# Patient Record
Sex: Male | Born: 1960 | Race: Black or African American | Hispanic: No | Marital: Married | Smoking: Never smoker
Health system: Southern US, Community
[De-identification: ages and names within clinical notes are randomized; demographics above are authoritative.]

## PROBLEM LIST (undated history)

## (undated) DIAGNOSIS — M109 Gout, unspecified: Secondary | ICD-10-CM

## (undated) DIAGNOSIS — M1990 Unspecified osteoarthritis, unspecified site: Secondary | ICD-10-CM

## (undated) DIAGNOSIS — I10 Essential (primary) hypertension: Secondary | ICD-10-CM

## (undated) HISTORY — PX: ROTATOR CUFF REPAIR: SHX139

## (undated) HISTORY — PX: HERNIA REPAIR: SHX51

## (undated) HISTORY — PX: TOE SURGERY: SHX1073

## (undated) HISTORY — PX: WRIST SURGERY: SHX841

## (undated) HISTORY — PX: TESTICLE SURGERY: SHX794

## (undated) HISTORY — PX: CERVICAL SPINE SURGERY: SHX589

---

## 2002-07-15 ENCOUNTER — Ambulatory Visit (HOSPITAL_COMMUNITY): Admission: RE | Admit: 2002-07-15 | Discharge: 2002-07-15 | Payer: Self-pay | Admitting: *Deleted

## 2002-08-22 ENCOUNTER — Encounter: Payer: Self-pay | Admitting: Family Medicine

## 2002-08-22 ENCOUNTER — Encounter: Admission: RE | Admit: 2002-08-22 | Discharge: 2002-08-22 | Payer: Self-pay | Admitting: Family Medicine

## 2003-02-20 ENCOUNTER — Encounter: Payer: Self-pay | Admitting: Family Medicine

## 2003-02-20 ENCOUNTER — Encounter: Admission: RE | Admit: 2003-02-20 | Discharge: 2003-02-20 | Payer: Self-pay | Admitting: Family Medicine

## 2003-07-03 ENCOUNTER — Encounter
Admission: RE | Admit: 2003-07-03 | Discharge: 2003-10-01 | Payer: Self-pay | Admitting: Physical Medicine and Rehabilitation

## 2003-10-02 ENCOUNTER — Observation Stay (HOSPITAL_COMMUNITY): Admission: RE | Admit: 2003-10-02 | Discharge: 2003-10-02 | Payer: Self-pay | Admitting: *Deleted

## 2003-10-10 ENCOUNTER — Emergency Department (HOSPITAL_COMMUNITY): Admission: EM | Admit: 2003-10-10 | Discharge: 2003-10-10 | Payer: Self-pay | Admitting: Emergency Medicine

## 2003-11-12 ENCOUNTER — Emergency Department (HOSPITAL_COMMUNITY): Admission: EM | Admit: 2003-11-12 | Discharge: 2003-11-12 | Payer: Self-pay | Admitting: Emergency Medicine

## 2004-07-22 ENCOUNTER — Emergency Department (HOSPITAL_COMMUNITY): Admission: EM | Admit: 2004-07-22 | Discharge: 2004-07-22 | Payer: Self-pay | Admitting: Emergency Medicine

## 2004-09-25 ENCOUNTER — Emergency Department (HOSPITAL_COMMUNITY): Admission: EM | Admit: 2004-09-25 | Discharge: 2004-09-25 | Payer: Self-pay | Admitting: Emergency Medicine

## 2004-10-08 ENCOUNTER — Encounter: Admission: RE | Admit: 2004-10-08 | Discharge: 2004-10-08 | Payer: Self-pay | Admitting: Family Medicine

## 2004-10-14 ENCOUNTER — Emergency Department (HOSPITAL_COMMUNITY): Admission: EM | Admit: 2004-10-14 | Discharge: 2004-10-14 | Payer: Self-pay | Admitting: Emergency Medicine

## 2006-10-19 ENCOUNTER — Emergency Department (HOSPITAL_COMMUNITY): Admission: EM | Admit: 2006-10-19 | Discharge: 2006-10-19 | Payer: Self-pay | Admitting: Emergency Medicine

## 2007-07-29 ENCOUNTER — Emergency Department (HOSPITAL_COMMUNITY): Admission: EM | Admit: 2007-07-29 | Discharge: 2007-07-29 | Payer: Self-pay | Admitting: Emergency Medicine

## 2009-08-08 ENCOUNTER — Emergency Department: Payer: Self-pay | Admitting: Emergency Medicine

## 2010-05-02 ENCOUNTER — Emergency Department (HOSPITAL_BASED_OUTPATIENT_CLINIC_OR_DEPARTMENT_OTHER)
Admission: EM | Admit: 2010-05-02 | Discharge: 2010-05-02 | Payer: Self-pay | Source: Home / Self Care | Admitting: Emergency Medicine

## 2010-05-14 ENCOUNTER — Emergency Department (HOSPITAL_BASED_OUTPATIENT_CLINIC_OR_DEPARTMENT_OTHER)
Admission: EM | Admit: 2010-05-14 | Discharge: 2010-05-14 | Payer: Self-pay | Source: Home / Self Care | Admitting: Emergency Medicine

## 2010-06-15 ENCOUNTER — Emergency Department (HOSPITAL_BASED_OUTPATIENT_CLINIC_OR_DEPARTMENT_OTHER)
Admission: EM | Admit: 2010-06-15 | Discharge: 2010-06-15 | Payer: Self-pay | Source: Home / Self Care | Admitting: Emergency Medicine

## 2010-10-04 NOTE — Assessment & Plan Note (Signed)
[name] is in for a recheck today.  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  He has had gradual  onset of neck and shoulder pain and more recently in the last four years,  some elbow and had pain, as well as some left knee pain.  He has been worked  up by rheumatology and a neurologist.  He brings in some of his blood tests  today that were done on June 30, 2003, which include CBC with  differential which was normal with the exception of a low granulocyte count  at 39 (normal between 43-77) and also elevated lymphocytes at 52 (normal  between 12-46).  Comprehensive metabolic panel was within normal limits and  HLAB27 was not detected.  CPK was 262 (normal between 24-195).   He also brings in bilateral hand, knee, and shoulder and cervical  radiographs.  These were reviewed.  Reports were not with these today.  They  will be sent later on.   He reports that he continues to have especially elbow pain and some hand  discomfort, especially after he has been driving.  He rates his pain as a 9  on a scale of 10.   I questioned him about sleeping.  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  He is unable to  get a good night's rest just because he is on the road.   Basically prolonged driving exacerbates his neck, shoulder, elbow, hand, and  knee pain.   He has been on Mobic 7.5 mg daily.  He ran out a few days ago.  He is not  sure it helped dramatically.   We talk at length today about his profession.  He tells me has been driving  25 years and feels as though he is not sure he can keep doing this for many  more years.  He tells me he is almost 35 and is thinking possibly about  switching careers at this point.   On exam today, he is a well-developed, well-nourished gentleman.  He is  appropriate and cooperative and not in any apparent distress.   His blood  pressure is 131/75, pulse 83, respirations 16, and 98% saturated  on room air.  He is able to get off of the exam table easily.  His gait is  normal in the room.  No antalgia.  He is able to walk on heels and toes  without difficulty.  Forward flexion and extension and lateral flexion of  his lumbar spine are within normal limits.  He has slight limitations with  cervical range of motion, just minimal decreased motion and range with  rotation and lateral flexion.  He does not remark on any discomfort during  these maneuvers.  He has full shoulder range of motion.  Internal and  external rotation are adequate and functional.  He has some limitations in  pronation and supination bilaterally.  He lacks approximately 5 degrees with  both pronation and supination on right and left.  He lacks approximately  about 20 degrees of wrist extension and about 10-15 degrees of full wrist  flexion and this is bilateral.  He reports some discomfort with wrist  flexion and extension.  There is, however, no edema or tenderness along the  joint line.  No tenderness today along the radial head with supination and  pronation either.  His muscle tone overall is normal.  The knees were  examined as well.  He has some mild crepitus over the left patella.  No  joint line tenderness.  No edema or fluid noted around the knee.  There is  no instability noted as well.   The heart was in regular rhythm.  The lungs were clear.  The abdomen was  benign.  Cranial nerves were grossly intact.  The sensory exam did not  reveal any deficits to light touch or vibratory sensation.  Romberg's test  was negative.  Reflexes were 1+ in biceps, triceps, brachioradialis, and 1+  at patellar tendons and Achilles' tendons.  Toes were downgoing.  No clonus  was noted.  Motor strength is in the 5/5 range in the upper and lower  extremities, including shoulder abductors, biceps, triceps, wrist extensors,  finger flexors, and intrinsics  and 5/5 at bilateral hip flexors, knee  extensors, dorsiflexors, plantar flexors, and EHL.   IMPRESSION:  1. Cervicalgia.  2. Bilateral patellar crepitus.  3. Elbow and hand musculoskeletal strain.  4. 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   PLAN:  1. Will add Ultracet two p.o. q.6h. p.r.n.  2. Will refill Mobic 7.5 mg one p.o. b.i.d.  3. Will add Prevacid 30 mg one p.o. daily.  4. Will refer the patient to vocational rehabilitation.  5. Will also consider Lidoderm.  6. Next visit, will consider physical therapy for education on proper body     mechanics and a stretching program.      Joshua Mercer, M.D.   DMK/MedQ  D:  08/09/2003 12:38:23  T:  08/09/2003 17:35:35  Job #:  [id number]   cc:   Leanne Chang, M.D.  [address]  [place]  [place] [address]  Fax: [phone]   Dr. Corliss Skains

## 2010-10-04 NOTE — Op Note (Signed)
NAME:  Joshua Mercer, Joshua Mercer                        ACCOUNT NO.:  [id number]   MEDICAL RECORD NO.:  [id number]                   PATIENT TYPE:  OBV   LOCATION:  0349                                 FACILITY:  [facility]   PHYSICIAN:  Vikki Ports, M.D.         DATE OF BIRTH:  16-May-1961   DATE OF PROCEDURE:  10/02/2003  DATE OF DISCHARGE:                                 OPERATIVE REPORT   PREOPERATIVE DIAGNOSIS:  Recurrent ventral hernia.   POSTOPERATIVE DIAGNOSIS:  Recurrent ventral hernia.   PROCEDURE:  Laparoscopic ventral hernia repair with mesh.   SURGEON:  Vikki Ports, M.D.   ANESTHESIA:  General.   DESCRIPTION OF PROCEDURE:  The patient was taken to the operating room and  placed in the supine position.  After adequate general anesthesia was  induced, the abdomen was prepped and draped in the normal sterile fashion.  Using the Optiview technique in the left upper quadrant, an 11 mm trocar was  introduced.  Pneumoperitoneum was obtained.  Two additional ports were  placed in the right abdomen.  Some omentum was caught and incarcerated  within the hernia defect, and this was brought down using blunt and sharp  dissection.  The defect measured only about 3 cm and I used a 12 cm circular  dual-sided Parietex mesh.  Anchoring #1 Novofil sutures were placed, and  these were tacked up to the anterior abdominal wall through separate  incisions.  This covered the defect completely.  The periphery was tacked  with the Endotacker.  Pneumoperitoneum was released, the trocars were  removed, the skin was closed with staples, injected with Marcaine.  The  patient tolerated the procedure well and went to PACU in stable condition.                                               Vikki Ports, M.D.    KRH/MEDQ  D:  10/02/2003  T:  10/02/2003  Job:  [id number]

## 2010-10-04 NOTE — Group Therapy Note (Signed)
CHIEF COMPLAINT:  Neck, shoulder, elbow, hands and left knee pain.   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  He reports a gradual onset over approximately the  last 7 years of neck, shoulder, elbow, hand and knee pain.  There is nothing  in particular that has brought these pains on, they have just been gradually  getting worse over the last several years.  He relates having neck and  shoulder pain back in 1997 or 1998.  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   In 2001 both of his elbows started bothering him quite a bit and also down  into the hands.  He describes the forearm pain that he gets as achy and he  describes most of his other types of pain as quite achy although he  describes his left knee as burning-like and on fire into the left knee area.  These problems occasionally wake him up at night and he cannot remain in any  position too long without feeling worse and the achiness wakes him up at  night.  He does get some intermittent numbness and tingling into the arms  but nothing persistent or constant.  He has tried over-the-counter type  medications including Ben-Gay and Icy Hot.  He has tried Vioxx, Celebrex,  Bextra, Ultracet, Tylenol and Advil.  He reports none of these completely get rid of his pain.  He has had an EMG  nerve conduction study in June which was apparently normal.  He has had an  elevated CPK, etiology which is not clear at this time.  The medical records  were reviewed and he is currently seeing Dr. Corliss Skains, he has an  appointment on July 28, 2003 with her.  Apparently she had recently done  blood work on him, the results of which are still pending.  He has also seen  neurologists and I do not have notes regarding their  findings.   FUNCTIONAL STATUS:  This gentleman is independent with all his activities of  daily living and mobility.   REVIEW OF SYSTEMS:  Negative for any fevers, chills, sweats, weight changes,  skin problems, lung or chest problems.  Denies any heart, kidney or liver  problems, hypertension, diabetes, ulcers, denies diarrhea, constipation,  cancers, bleeding disorders or thyroid problems to his knowledge.   He reports his mood is somewhat depressed but he denies that he could harm  himself or others.   SOCIAL HISTORY:  He has been married 20 years, he last worked on June 09, 2003.  He denies alcohol, tobacco, or illegal drug use.  He lives with his  wife and his father-in-law.   FAMILY HISTORY:  His mother is 51 and has hypertension.  He does not know  his Dad's medical history.  He has 3 brothers, some have hypertension and  arthritis.   ALLERGIES:  He denies drug allergies.   MEDICATIONS:  Currently not on any medications.   PAST MEDICAL HISTORY:  Negative.   PAST SURGICAL HISTORY:  Remarkable for umbilical herniorrhaphy x3.  In  December, 2001 he had arthroscopic right shoulder surgery, and in December,  1998 he had a cyst removed from his left wrist.   PHYSICAL EXAMINATION:  Blood pressure was 124/88, pulse 69, respirations 16,  98% saturation on room air.  He is a well-developed, well-nourished gentleman in no apparent distress.  He was appropriate and cooperative during the interview.  Skin was  unremarkable.  HEENT was within normal limits.  Neck is supple without any  adenopathy.  He had good range of motion.  Heart was in regular rhythm,  lungs clear.  Abdomen soft, bowel sounds positive.  He was able to get off of the exam table easily.  His gait was normal in the  room.  Heel-toe walking was normal.  Romberg's test was normal.  Cranial  nerves were grossly normal.  Sensory exam without sensory deficits noted  with pin prick in the upper or lower extremities.   Reflexes were 2+ at the  biceps, triceps, brachioradialis bilaterally.  Reflexes were 2+ at the  patellar tendon and Achilles tendon bilaterally.  Toes are down going, no  clonus is noted.  Motor strength in the upper extremities was 5/5 without  any obvious weakness including shoulder abductors, biceps, triceps,  brachioradialis, pronation, supination, finger flexors and intrinsics.  The  lower extremity strength was also tested and was all in the 5/5 range  including hip flexors, knee extensors, dorsiflexors, plantar flexors,_____  and EHL.  No crepitus was noted at the left or right knee.  There was no  swelling.  No tenderness was noted along the medial or lateral joint line.  No medial or lateral instability was noted.  Lachman's test was negative.  Upper extremity exam revealed some mild crepitus bilaterally at the radial  head, no tenderness with palpation along the medial or lateral epicondyle  however.  He seemed to have some diffuse tenderness throughout the forearm  musculature with palpation.  There was no swelling noted at the wrist joints  or in the hands at all.  His shoulder range of motion was good, he had 90  degrees of external rotation and approximately 45% degrees of internal  rotation bilaterally.  He had full supination and pronation.  He seemed to  lack a few degrees of full extension at both elbows however.   Results were reviewed of the MRI of the cervical spine which was done August 22, 2002, I do not have the actual scans in the clinic today.  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  There is a small central protrusion at C4-5 with  effacement of the anterior subarachnoid space but not cord compression.   IMPRESSION:  Multiple musculoskeletal complaints including pain in the  shoulders, elbows, hands, especially left knee and neck area.  The patient has had an elevated CPK and is currently  getting a rheumatologic work up  through Dr. Corliss Skains and blood results are still pending.  He has had an  essentially EMG and MRI which shows some mild borderline cervical canal  stenosis.  These symptoms have been slowly progressive over the last 7 or so  years.  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   Will start him on Mobick 7.5 daily, a 30 days supply.  He will let us know  if he has any abdominal problems with this and will wait for some of the  other specialists to get back some of their blood work  results.  Will hold  off on doing any other testing at this time.  Will see him back in  approximately one month and reassess him once we have some more information  regarding his tests.     Joshua Mercer, M.D.   DMK/MedQ  D:  07/05/2003 17:05:04  T:  07/05/2003 19:43:11  Job #:  [id number]   cc:   Leanne Chang, M.D.  [address]  [place]  [place] [address]  Fax: [phone]

## 2010-10-04 NOTE — Op Note (Signed)
   NAME:  Joshua Mercer, Joshua Mercer                          ACCOUNT NO.:  [id number]   MEDICAL RECORD NO.:  [id number]                   PATIENT TYPE:  AMB   LOCATION:  DAY                                  FACILITY:  [facility]   PHYSICIAN:  Vikki Ports, M.D.         DATE OF BIRTH:  08-Nov-1960   DATE OF PROCEDURE:  07/15/2002  DATE OF DISCHARGE:                                 OPERATIVE REPORT   PREOPERATIVE DIAGNOSIS:  Recurrent umbilical hernia.   POSTOPERATIVE DIAGNOSIS:  Recurrent umbilical hernia.   PROCEDURE:  Umbilical hernia repair with mesh.   SURGEON:  Vikki Ports, M.D.   ANESTHESIA:  General.   DESCRIPTION OF PROCEDURE:  The patient was taken to the operating room and  placed in a supine position.  After adequate general anesthesia was induced,  the abdomen was prepped and draped in the normal sterile fashion.  Using the  extended previous infraumbilical incision, I dissected down onto the hernia  sac.  There was still suture and mesh in place.  The defect was about 4 cm  in diameter, and there was some weakness superiorly to it.  After the  omentum was released from the edges of the hernia defect and returned to the  abdominal cavity, the defect was closed with interrupted #1 Surgilon, and  flaps were created just above the fascia in all directions.  A 5 x 4 inch  polyester mesh was then placed over the repair and tacked in the periphery  with running 2-0 Prolene suture.  JP drain was placed over the repair.  Skin  was closed with staples.  All tissues were injected using Marcaine.  A  sterile dressing was applied.  The patient tolerated the procedure well and  went to PACU in good condition.                                               Vikki Ports, M.D.    KRH/MEDQ  D:  07/15/2002  T:  07/15/2002  Job:  [id number]

## 2010-10-04 NOTE — Assessment & Plan Note (Signed)
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  He has had a gradual onset of neck and shoulder  pain, as well as some elbow pain and some knee pain on the left.  He has  seen a rheumatologist and was evaluated for an elevated CPK.  It is felt  that the elevated CPK was secondary to his having a good amount of muscle  mass.  He has also had several tests, including CBC with differential,  sedimentation rate, comprehensive metabolic panel, TSH, rheumatoid factor,  compliments, ANA, UA, aldolase, and __________, which were all within normal  limits.  Apparently Dr. Corliss Skains offered him to have a knee injection and  he declined.  She considered Neurontin and Lidoderm and he declined as well.  He is felt to have some myofascial-type pain problems.   He is in today for recheck.  He tells me that the Ultracet does not really  help that much.  He still has quite a bit at home.  Mobic helps.  He has  been taking that.  He also takes Prevacid.  He had some abdominal complaints  and did stop the Mobic, however.  He has been diagnosed with an umbilical  hernia and is to see a surgeon apparently in approximately two days.  He  continues to work.  We discussed this.  He seems frustrated with his job.  He has been a Naval architect for approximately 25 years and he seems to be  having trouble keeping up with the pace that he has had over those years.  He is feeling like he may need to look into other lines of vocation.  He was  given a vocational rehabilitation referral.  He brings in a sheet today from  the [facility] [employer] and a form  for me to fill out.  Essentially he is having trouble maintaining his pace  of long distance driving with very little sleep.  He thinks that it would be  beneficial to try to find some other type of work.  However, he feels his  skills are not adequate to enter the work force  at his age.   MEDICATIONS:  His medications at this time include Mobic 7.5 mg daily.   PHYSICAL EXAMINATION:  On exam today, he is a very well-developed, well-  nourished gentleman.  He does not appear in any distress.  He is  cooperative.  He appears somewhat frustrated with his current situation.  His blood pressure is 157/103.  We remarked to him that it is elevated.  His  pulse is 87, respirations 16, and 98% saturated on room air.  He is able to  get off of the exam table.  His gait in the room is normal.  He has good  lumbar flexion, extension, and lateral flexion.  Cervical range of motion is  quite good as well.  He has full shoulder range of motion in both shoulders.  His heart is in regular rhythm.  The lungs are clear.  The abdomen is  benign.  He has normal tone.  No tremor noted in any of his extremities.  No  mottling.  No erythema or cyanosis noted.  No edema is noted.  He has no  tenderness with palpation along his shoulders or his lateral or medial  epicondyle area.  He has good range of motion with pronation and supination.  Good strength in his hands.  Reflexes are symmetric and intact in the upper and lower extremities.  No  sensory deficits are noted.  Motor strength is 5/5.   IMPRESSION:  1. Myofascial shoulder, elbow, and hand strain.  2. Cervicalgia.   PLAN:  Will go ahead and fill out the [employer] form.  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  He is having a difficult time recovery  after long truck rides and is continuing to get a limited amount of sleep,  which is again making it difficult for him to recover after these long  distances.  Will give him a prescription for Lidoderm and will see him back  in a month.  Will see if vocational rehabilitation can assist him in  possibly  retraining and finding a new line of work for him.    Joshua Mercer, M.D.   DMK/MedQ  D:  09/06/2003 17:53:39  T:  09/06/2003 19:07:42  Job #:  [id number]

## 2011-05-31 ENCOUNTER — Emergency Department (INDEPENDENT_AMBULATORY_CARE_PROVIDER_SITE_OTHER): Payer: 59

## 2011-05-31 ENCOUNTER — Emergency Department (HOSPITAL_BASED_OUTPATIENT_CLINIC_OR_DEPARTMENT_OTHER)
Admission: EM | Admit: 2011-05-31 | Discharge: 2011-05-31 | Disposition: A | Discharge status: Home / Self Care | Payer: 59 | Attending: Emergency Medicine | Admitting: Emergency Medicine

## 2011-05-31 ENCOUNTER — Encounter (HOSPITAL_BASED_OUTPATIENT_CLINIC_OR_DEPARTMENT_OTHER): Payer: Self-pay | Admitting: *Deleted

## 2011-05-31 ENCOUNTER — Other Ambulatory Visit: Payer: Self-pay

## 2011-05-31 DIAGNOSIS — R079 Chest pain, unspecified: Secondary | ICD-10-CM | POA: Insufficient documentation

## 2011-05-31 DIAGNOSIS — I10 Essential (primary) hypertension: Secondary | ICD-10-CM | POA: Insufficient documentation

## 2011-05-31 DIAGNOSIS — Z79899 Other long term (current) drug therapy: Secondary | ICD-10-CM | POA: Insufficient documentation

## 2011-05-31 DIAGNOSIS — Z87891 Personal history of nicotine dependence: Secondary | ICD-10-CM

## 2011-05-31 HISTORY — DX: Essential (primary) hypertension: I10

## 2011-05-31 HISTORY — DX: Unspecified osteoarthritis, unspecified site: M19.90

## 2011-05-31 LAB — CBC
HCT: 41.3 % (ref 39.0–52.0)
MCHC: 33.9 g/dL (ref 30.0–36.0)
MCV: 78.4 fL (ref 78.0–100.0)
RDW: 13.4 % (ref 11.5–15.5)

## 2011-05-31 LAB — BASIC METABOLIC PANEL
BUN: 18 mg/dL (ref 6–23)
CO2: 28 mEq/L (ref 19–32)
Chloride: 105 mEq/L (ref 96–112)
Creatinine, Ser: 1.3 mg/dL (ref 0.50–1.35)
Potassium: 3.4 mEq/L — ABNORMAL LOW (ref 3.5–5.1)
Sodium: 142 mEq/L (ref 135–145)

## 2011-05-31 LAB — TROPONIN I: Troponin I: <0.3 ng/mL (ref <0.30)

## 2011-05-31 MED ORDER — MORPHINE SULFATE 4 MG/ML IJ SOLN
4.0000 mg | Freq: Once | INTRAMUSCULAR | Status: AC
  Administered 2011-05-31: 4 mg via INTRAVENOUS

## 2011-05-31 MED ORDER — GI COCKTAIL ~~LOC~~
30.0000 mL | Freq: Once | ORAL | Status: AC
  Administered 2011-05-31: 30 mL via ORAL
  Filled 2011-05-31: qty 30

## 2011-05-31 MED ORDER — MORPHINE SULFATE 4 MG/ML IJ SOLN
INTRAMUSCULAR | Status: AC
  Filled 2011-05-31: qty 1

## 2011-05-31 MED ORDER — MORPHINE SULFATE 4 MG/ML IJ SOLN
4.0000 mg | Freq: Once | INTRAMUSCULAR | Status: AC
  Administered 2011-05-31: 4 mg via INTRAVENOUS
  Filled 2011-05-31: qty 1

## 2011-05-31 MED ORDER — ONDANSETRON HCL 4 MG/2ML IJ SOLN
4.0000 mg | Freq: Once | INTRAMUSCULAR | Status: AC
  Administered 2011-05-31: 4 mg via INTRAVENOUS
  Filled 2011-05-31: qty 2

## 2011-05-31 NOTE — ED Notes (Signed)
Patient states that approx one hour ago he started experiencing Mid chest pain that has not eased off and concerned due to hypertension, no sob, no n/v

## 2011-05-31 NOTE — ED Provider Notes (Signed)
History     CSN: [id number]  Arrival date & time 05/31/11  1257   First MD Initiated Contact with Patient 05/31/11 1357      Chief Complaint  Patient presents with  . Chest Pain    (Consider location/radiation/quality/duration/timing/severity/associated sxs/prior treatment) Patient is a 51 y.o. male presenting with chest pain. The history is provided by the patient. No language interpreter was used.  Chest Pain The chest pain began 3 - 5 hours ago. Chest pain occurs constantly. The chest pain is unchanged. The severity of the pain is moderate. The quality of the pain is described as heavy. The pain does not radiate. Chest pain is worsened by deep breathing. Pertinent negatives for primary symptoms include no fever, no shortness of breath, no cough, no palpitations, no abdominal pain, no nausea, no vomiting and no dizziness.  Pertinent negatives for associated symptoms include no diaphoresis, no lower extremity edema, no near-syncope and no weakness. He tried nothing for the symptoms. Risk factors include male gender.  His past medical history is significant for hypertension.  His family medical history is significant for hypertension in family.     Past Medical History  Diagnosis Date  . Hypertension   . Arthritis     Past Surgical History  Procedure Date  . Hernia repair   . Wrist surgery   . Rotator cuff repair     right  . Toe surgery     R foot  . Testicle surgery     No family history on file.  History  Substance Use Topics  . Smoking status: Never Smoker   . Smokeless tobacco: Not on file  . Alcohol Use: No      Review of Systems  Constitutional: Negative for fever and diaphoresis.  Respiratory: Negative for cough and shortness of breath.   Cardiovascular: Positive for chest pain. Negative for palpitations and near-syncope.  Gastrointestinal: Negative for nausea, vomiting and abdominal pain.  Neurological: Negative for dizziness and weakness.  All other  systems reviewed and are negative.    Allergies  Review of patient's allergies indicates no known allergies.  Home Medications   Current Outpatient Rx  Name Route Sig Dispense Refill  . CYCLOBENZAPRINE HCL 5 MG PO TABS Oral Take 10 mg by mouth 3 (three) times daily as needed.    Marland Kitchen HYDROCHLOROTHIAZIDE 25 MG PO TABS Oral Take 25 mg by mouth daily.    Marland Kitchen HYDROCODONE-ACETAMINOPHEN 5-325 MG PO TABS Oral Take 1 tablet by mouth every 6 (six) hours as needed.    . INDOMETHACIN ER PO Oral Take by mouth.    . METOPROLOL SUCCINATE ER 100 MG PO TB24 Oral Take 100 mg by mouth daily. Take with or immediately following a meal.      BP 108/67  Pulse 55  Temp(Src) 98.7 F (37.1 C) (Oral)  Resp 18  SpO2 99%  Physical Exam  Nursing note and vitals reviewed. Constitutional: He is oriented to person, place, and time. He appears well-developed and well-nourished.  HENT:  Head: Normocephalic and atraumatic.  Eyes: EOM are normal. Pupils are equal, round, and reactive to light.  Neck: Neck supple.  Cardiovascular: Normal rate and regular rhythm.   Pulmonary/Chest: Effort normal and breath sounds normal.        Mild Substernal tenderness with palpation  Abdominal: Soft.  Neurological: He is alert and oriented to person, place, and time.  Skin: Skin is warm and dry.  Psychiatric: He has a normal mood and affect.  ED Course  Procedures (including critical care time)  Labs Reviewed  BASIC METABOLIC PANEL - Abnormal; Notable for the following:    Potassium 3.4 (*)    GFR calc non Af Amer 62 (*)    GFR calc Af Amer 71 (*)    All other components within normal limits  CBC  TROPONIN I  TROPONIN I   Dg Chest 2 View  05/31/2011  *RADIOLOGY REPORT*  Clinical Data: Chest pain.  Ex-smoker.  CHEST - 2 VIEW  Comparison: 05/14/2010  Findings: Midline trachea.  Normal heart size and mediastinal contours. No pleural effusion or pneumothorax.  Clear lungs.  IMPRESSION: Normal chest.  Original Report  Authenticated By: Consuello Bossier, M.D.    Date: 05/31/2011  Rate: 62  Rhythm: normal sinus rhythm  QRS Axis: normal  Intervals: normal  ST/T Wave abnormalities: normal  Conduction Disutrbances:none  Narrative Interpretation:   Old EKG Reviewed: unchanged    1. Chest pain       MDM  Pt timi score is 0:pt is not having any cp:ecg in normal:delta troponin is negative:think it is reasonable to send pt home and follow up as needed        Teressa Lower, NP 05/31/11 1839

## 2011-06-01 NOTE — ED Provider Notes (Signed)
Medical screening examination/treatment/procedure(s) were performed by non-physician practitioner and as supervising physician I was immediately available for consultation/collaboration.  Ethelda Chick, MD 06/01/11 [phone]

## 2012-07-04 ENCOUNTER — Emergency Department (HOSPITAL_BASED_OUTPATIENT_CLINIC_OR_DEPARTMENT_OTHER)
Admission: EM | Admit: 2012-07-04 | Discharge: 2012-07-04 | Disposition: A | Discharge status: Home / Self Care | Payer: 59 | Attending: Emergency Medicine | Admitting: Emergency Medicine

## 2012-07-04 ENCOUNTER — Encounter (HOSPITAL_BASED_OUTPATIENT_CLINIC_OR_DEPARTMENT_OTHER): Payer: Self-pay | Admitting: *Deleted

## 2012-07-04 DIAGNOSIS — R51 Headache: Secondary | ICD-10-CM | POA: Insufficient documentation

## 2012-07-04 DIAGNOSIS — R630 Anorexia: Secondary | ICD-10-CM | POA: Insufficient documentation

## 2012-07-04 DIAGNOSIS — M129 Arthropathy, unspecified: Secondary | ICD-10-CM | POA: Insufficient documentation

## 2012-07-04 DIAGNOSIS — R1013 Epigastric pain: Secondary | ICD-10-CM | POA: Insufficient documentation

## 2012-07-04 DIAGNOSIS — I10 Essential (primary) hypertension: Secondary | ICD-10-CM | POA: Insufficient documentation

## 2012-07-04 DIAGNOSIS — R112 Nausea with vomiting, unspecified: Secondary | ICD-10-CM | POA: Insufficient documentation

## 2012-07-04 DIAGNOSIS — R197 Diarrhea, unspecified: Secondary | ICD-10-CM | POA: Insufficient documentation

## 2012-07-04 DIAGNOSIS — R6883 Chills (without fever): Secondary | ICD-10-CM | POA: Insufficient documentation

## 2012-07-04 DIAGNOSIS — Z79899 Other long term (current) drug therapy: Secondary | ICD-10-CM | POA: Insufficient documentation

## 2012-07-04 LAB — COMPREHENSIVE METABOLIC PANEL
ALT: 32 U/L (ref 0–53)
Alkaline Phosphatase: 58 U/L (ref 39–117)
CO2: 25 mEq/L (ref 19–32)
Chloride: 103 mEq/L (ref 96–112)
GFR calc Af Amer: 65 mL/min — ABNORMAL LOW (ref >90)
GFR calc non Af Amer: 56 mL/min — ABNORMAL LOW (ref >90)
Glucose, Bld: 94 mg/dL (ref 70–99)
Sodium: 139 mEq/L (ref 135–145)
Total Bilirubin: 0.5 mg/dL (ref 0.3–1.2)

## 2012-07-04 LAB — CBC
HCT: 44.2 % (ref 39.0–52.0)
Platelets: 264 10*3/uL (ref 150–400)
RDW: 14.2 % (ref 11.5–15.5)
WBC: 5.5 10*3/uL (ref 4.0–10.5)

## 2012-07-04 LAB — URINALYSIS, ROUTINE W REFLEX MICROSCOPIC
Hgb urine dipstick: NEGATIVE
Ketones, ur: NEGATIVE mg/dL
Specific Gravity, Urine: 1.042 — ABNORMAL HIGH (ref 1.005–1.030)
Urobilinogen, UA: 0.2 mg/dL (ref 0.0–1.0)

## 2012-07-04 LAB — URINE MICROSCOPIC-ADD ON

## 2012-07-04 MED ORDER — ONDANSETRON HCL 4 MG/2ML IJ SOLN
4.0000 mg | Freq: Once | INTRAMUSCULAR | Status: AC
  Administered 2012-07-04: 4 mg via INTRAVENOUS
  Filled 2012-07-04: qty 2

## 2012-07-04 MED ORDER — SODIUM CHLORIDE 0.9 % IV BOLUS (SEPSIS)
1000.0000 mL | Freq: Once | INTRAVENOUS | Status: AC
  Administered 2012-07-04: 1000 mL via INTRAVENOUS

## 2012-07-04 MED ORDER — ONDANSETRON HCL 4 MG PO TABS: 4.0000 mg | ORAL_TABLET | Freq: Four times a day (QID) | ORAL | Status: DC

## 2012-07-04 MED ORDER — ACETAMINOPHEN 325 MG PO TABS
650.0000 mg | ORAL_TABLET | Freq: Once | ORAL | Status: AC
  Administered 2012-07-04: 650 mg via ORAL
  Filled 2012-07-04: qty 2

## 2012-07-04 MED ORDER — ONDANSETRON 4 MG PO TBDP
4.0000 mg | ORAL_TABLET | Freq: Once | ORAL | Status: AC
  Administered 2012-07-04: 4 mg via ORAL
  Filled 2012-07-04: qty 1

## 2012-07-04 NOTE — ED Provider Notes (Signed)
History  This chart was scribed for Ethelda Chick, MD by Shari Heritage, ED Scribe. The patient was seen in room MH03/MH03. Patient's care was started at 1849.   CSN: [id number]  Arrival date & time 07/04/12  1630   First MD Initiated Contact with Patient 07/04/12 1849      Chief Complaint  Patient presents with  . Diarrhea    Patient is a 52 y.o. male presenting with diarrhea. The history is provided by the patient. No language interpreter was used.  Diarrhea Quality:  Watery Severity:  Moderate Onset quality:  Sudden Duration:  1 day Timing:  Constant Progression:  Unchanged Associated symptoms: abdominal pain and vomiting   Associated symptoms: no fever   Abdominal pain:    Location:  Epigastric   Quality:  Cramping   Severity:  Mild   Onset quality:  Sudden   Duration:  1 day   Timing:  Constant   Chronicity:  New    HPI Comments: Joshua Mercer is a 52 y.o. male with history of hypertension and arthritis who presents to the Emergency Department complaining of sudden, persistent nausea, vomiting and diarrhea onset yesterday night. There is associated mild epigastric abdominal cramping, chills, headache and decreased appetite. Patient states that he had constant abdominal pain for a few hours last night, but today he only has intermittent cramping before bowel movements. Patient denies fever or dysuria. Patient states that he has been tolerant of small amounts of water today, but no food. Patient denies any sick contacts with similar symptoms. Patient does not smoke.   Past Medical History  Diagnosis Date  . Hypertension   . Arthritis     Past Surgical History  Procedure Laterality Date  . Hernia repair    . Wrist surgery    . Rotator cuff repair      right  . Toe surgery      R foot  . Testicle surgery      History reviewed. No pertinent family history.  History  Substance Use Topics  . Smoking status: Never Smoker   . Smokeless tobacco: Not on file   . Alcohol Use: No      Review of Systems  Constitutional: Negative for fever.  Gastrointestinal: Positive for nausea, vomiting, abdominal pain and diarrhea.  Genitourinary: Negative for dysuria.  All other systems reviewed and are negative.    Allergies  Review of patient's allergies indicates no known allergies.  Home Medications   Current Outpatient Rx  Name  Route  Sig  Dispense  Refill  . ALLOPURINOL PO   Oral   Take by mouth.         . cyclobenzaprine (FLEXERIL) 5 MG tablet   Oral   Take 10 mg by mouth 3 (three) times daily as needed.         . hydrochlorothiazide (HYDRODIURIL) 25 MG tablet   Oral   Take 25 mg by mouth daily.         Marland Kitchen HYDROcodone-acetaminophen (NORCO) 5-325 MG per tablet   Oral   Take 1 tablet by mouth every 6 (six) hours as needed.         . INDOMETHACIN ER PO   Oral   Take by mouth.         . metoprolol succinate (TOPROL-XL) 100 MG 24 hr tablet   Oral   Take 100 mg by mouth daily. Take with or immediately following a meal.  Triage Vitals: BP 133/85  Pulse 56  Temp(Src) 99.6 F (37.6 C)  Resp 20  Ht 5\' 9"  (1.753 m)  Wt 230 lb (104.327 kg)  BMI 33.95 kg/m2  SpO2 100%  Physical Exam  Nursing note and vitals reviewed. Constitutional: He is oriented to person, place, and time. He appears well-developed and well-nourished.  HENT:  Head: Normocephalic and atraumatic.  Mouth/Throat: Oropharynx is clear and moist and mucous membranes are normal. Mucous membranes are not dry.  Eyes: Conjunctivae and EOM are normal. Pupils are equal, round, and reactive to light.  Neck: Normal range of motion. Neck supple.  Cardiovascular: Normal rate, regular rhythm and normal heart sounds.   Pulmonary/Chest: Effort normal and breath sounds normal.  Abdominal: Soft. Bowel sounds are normal. He exhibits no distension. There is no tenderness. There is no rebound and no guarding.  Musculoskeletal: Normal range of motion.   Neurological: He is alert and oriented to person, place, and time.  Skin: Skin is warm and dry.  Psychiatric: He has a normal mood and affect.    ED Course  Procedures (including critical care time) DIAGNOSTIC STUDIES: Oxygen Saturation is 100% on room air, normal by my interpretation.    COORDINATION OF CARE: 6:59 PM- Patient informed of current plan for treatment and evaluation and agrees with plan at this time.    Labs Reviewed  URINALYSIS, ROUTINE W REFLEX MICROSCOPIC - Abnormal; Notable for the following:    Color, Urine AMBER (*)    Specific Gravity, Urine 1.042 (*)    Bilirubin Urine SMALL (*)    Protein, ur 30 (*)    All other components within normal limits  URINE MICROSCOPIC-ADD ON - Abnormal; Notable for the following:    Bacteria, UA FEW (*)    All other components within normal limits  COMPREHENSIVE METABOLIC PANEL - Abnormal; Notable for the following:    Creatinine, Ser 1.40 (*)    AST 48 (*)    GFR calc non Af Amer 56 (*)    GFR calc Af Amer 65 (*)    All other components within normal limits  URINE CULTURE  CBC     1. Nausea vomiting and diarrhea       MDM  Pt presenting with acute onset of nausea/vomiting/diarrhea.  Suspect viral gastroenteritis, labs area reassuring with the exception of isolated mild elevation in AST.  Pt feels much improved after IV hydration and antiemetics.  Other possibilities are biliary colic, PUD.  Doubt acute intraabdominal pathology that requires further treatment in the ED at this time. Discharged with strict return precautions.  Pt agreeable with plan.    I personally performed the services described in this documentation, which was scribed in my presence. The recorded information has been reviewed and is accurate.    Ethelda Chick, MD 07/07/12 2106

## 2012-07-04 NOTE — ED Notes (Signed)
Pt states he has had diarrhea,N/V since last p.m.

## 2012-07-04 NOTE — ED Notes (Signed)
Pt. C/o of general h/a. Rates 6/10. Will update MD

## 2012-07-04 NOTE — ED Notes (Signed)
Pt. States he feels "a little" nauseated. MD aware and orders received. Tolerated sips of fluids. No emesis after fluids.

## 2012-07-06 LAB — URINE CULTURE
Colony Count: NO GROWTH
Culture: NO GROWTH

## 2013-04-03 ENCOUNTER — Encounter (HOSPITAL_BASED_OUTPATIENT_CLINIC_OR_DEPARTMENT_OTHER): Payer: Self-pay | Admitting: Emergency Medicine

## 2013-04-03 ENCOUNTER — Emergency Department (HOSPITAL_BASED_OUTPATIENT_CLINIC_OR_DEPARTMENT_OTHER)
Admission: EM | Admit: 2013-04-03 | Discharge: 2013-04-03 | Disposition: A | Discharge status: Home / Self Care | Payer: 59 | Attending: Emergency Medicine | Admitting: Emergency Medicine

## 2013-04-03 DIAGNOSIS — Z79899 Other long term (current) drug therapy: Secondary | ICD-10-CM | POA: Insufficient documentation

## 2013-04-03 DIAGNOSIS — I10 Essential (primary) hypertension: Secondary | ICD-10-CM | POA: Insufficient documentation

## 2013-04-03 DIAGNOSIS — M5412 Radiculopathy, cervical region: Secondary | ICD-10-CM

## 2013-04-03 DIAGNOSIS — M109 Gout, unspecified: Secondary | ICD-10-CM | POA: Insufficient documentation

## 2013-04-03 HISTORY — DX: Gout, unspecified: M10.9

## 2013-04-03 MED ORDER — HYDROMORPHONE HCL PF 1 MG/ML IJ SOLN
INTRAMUSCULAR | Status: AC
  Filled 2013-04-03: qty 1

## 2013-04-03 MED ORDER — PREDNISONE 20 MG PO TABS: ORAL_TABLET | ORAL | Status: DC

## 2013-04-03 MED ORDER — OXYCODONE-ACETAMINOPHEN 5-325 MG PO TABS: 2.0000 | ORAL_TABLET | ORAL | Status: DC | PRN

## 2013-04-03 MED ORDER — PREDNISONE 50 MG PO TABS
60.0000 mg | ORAL_TABLET | Freq: Once | ORAL | Status: AC
  Administered 2013-04-03: 60 mg via ORAL
  Filled 2013-04-03 (×2): qty 1

## 2013-04-03 MED ORDER — HYDROMORPHONE HCL PF 1 MG/ML IJ SOLN
2.0000 mg | Freq: Once | INTRAMUSCULAR | Status: AC
  Administered 2013-04-03: 2 mg via INTRAMUSCULAR
  Filled 2013-04-03: qty 2

## 2013-04-03 MED ORDER — OXYCODONE-ACETAMINOPHEN 5-325 MG PO TABS: 1.0000 | ORAL_TABLET | ORAL | Status: DC | PRN

## 2013-04-03 NOTE — ED Notes (Addendum)
Chronic Left shoulder pain that worsened this Wednesday past and has become unbearable today denies injury or event

## 2013-04-03 NOTE — ED Provider Notes (Signed)
CSN: [id number]     Arrival date & time 04/03/13  1950 History   First MD Initiated Contact with Patient 04/03/13 1958     Chief Complaint  Patient presents with  . Shoulder Pain   (Consider location/radiation/quality/duration/timing/severity/associated sxs/prior Treatment) Patient is a 52 y.o. male presenting with shoulder pain. The history is provided by the patient. No language interpreter was used.  Shoulder Pain This is a recurrent problem. The current episode started yesterday. Pertinent negatives include no chills, fever or numbness. Associated symptoms comments: He has pain affecting his left neck extending into posterior shoulder and left arm that is recurrent pain. He reports known "disc problem" diagnosed in the past by MRI. He went to pain management for injections that have become less effective. Currently, the pain is severe without recent inciting event. No weakness of arm or numbness. .    Past Medical History  Diagnosis Date  . Hypertension   . Arthritis   . Gout    Past Surgical History  Procedure Laterality Date  . Hernia repair    . Wrist surgery    . Rotator cuff repair      right  . Toe surgery      R foot  . Testicle surgery     History reviewed. No pertinent family history. History  Substance Use Topics  . Smoking status: Never Smoker   . Smokeless tobacco: Not on file  . Alcohol Use: No    Review of Systems  Constitutional: Negative for fever and chills.  Musculoskeletal: Negative.        See HPI  Skin: Negative.   Neurological: Negative.  Negative for numbness.    Allergies  Review of patient's allergies indicates no known allergies.  Home Medications   Current Outpatient Rx  Name  Route  Sig  Dispense  Refill  . ALLOPURINOL PO   Oral   Take by mouth.         . cyclobenzaprine (FLEXERIL) 5 MG tablet   Oral   Take 10 mg by mouth 3 (three) times daily as needed.         . hydrochlorothiazide (HYDRODIURIL) 25 MG tablet   Oral  Take 25 mg by mouth daily.         Marland Kitchen HYDROcodone-acetaminophen (NORCO) 5-325 MG per tablet   Oral   Take 1 tablet by mouth every 6 (six) hours as needed.         . INDOMETHACIN ER PO   Oral   Take by mouth.         . metoprolol succinate (TOPROL-XL) 100 MG 24 hr tablet   Oral   Take 100 mg by mouth daily. Take with or immediately following a meal.         . ondansetron (ZOFRAN) 4 MG tablet   Oral   Take 1 tablet (4 mg total) by mouth every 6 (six) hours.   12 tablet   0    BP 158/100  Pulse 62  Temp(Src) 98.6 F (37 C) (Oral)  Resp 18  SpO2 97% Physical Exam  Constitutional: He is oriented to person, place, and time. He appears well-developed and well-nourished.  Neck: Normal range of motion.  Cardiovascular: Intact distal pulses.   Pulmonary/Chest: Effort normal.  Musculoskeletal: Normal range of motion.  Mild left paracervical tenderness without swelling. FROM of left upper extremity with increased/severe pain on abduction. 5/5 grip strength.  Neurological: He is alert and oriented to person, place, and time.  Skin:  Skin is warm and dry.  Psychiatric: He has a normal mood and affect.    ED Course  Procedures (including critical care time) Labs Review Labs Reviewed - No data to display Imaging Review No results found.  EKG Interpretation   None       MDM  No diagnosis found. 1. Cervical radiculopathy  No strength or neurologic deficits on exam. Pain medication with some relief. Will refer to neurosurgery for further evaluation and management of known radiculopathy.    Arnoldo Hooker, PA-C 04/03/13 2119

## 2013-04-03 NOTE — ED Provider Notes (Signed)
  Medical screening examination/treatment/procedure(s) were performed by non-physician practitioner and as supervising physician I was immediately available for consultation/collaboration.  EKG Interpretation   None          Gerhard Munch, MD 04/03/13 2147

## 2013-11-01 ENCOUNTER — Other Ambulatory Visit: Payer: Self-pay | Admitting: Orthopedic Surgery

## 2013-11-01 DIAGNOSIS — M542 Cervicalgia: Secondary | ICD-10-CM

## 2013-11-02 ENCOUNTER — Ambulatory Visit
Admission: RE | Admit: 2013-11-02 | Discharge: 2013-11-02 | Disposition: A | Discharge status: Home / Self Care | Payer: 59 | Source: Ambulatory Visit | Attending: Orthopedic Surgery | Admitting: Orthopedic Surgery

## 2013-11-02 DIAGNOSIS — M542 Cervicalgia: Secondary | ICD-10-CM

## 2013-11-29 ENCOUNTER — Encounter (HOSPITAL_BASED_OUTPATIENT_CLINIC_OR_DEPARTMENT_OTHER): Payer: Self-pay | Admitting: Emergency Medicine

## 2013-11-29 ENCOUNTER — Emergency Department (HOSPITAL_BASED_OUTPATIENT_CLINIC_OR_DEPARTMENT_OTHER)
Admission: EM | Admit: 2013-11-29 | Discharge: 2013-11-29 | Disposition: A | Discharge status: Home / Self Care | Payer: 59 | Attending: Emergency Medicine | Admitting: Emergency Medicine

## 2013-11-29 DIAGNOSIS — I10 Essential (primary) hypertension: Secondary | ICD-10-CM | POA: Insufficient documentation

## 2013-11-29 DIAGNOSIS — M10072 Idiopathic gout, left ankle and foot: Secondary | ICD-10-CM

## 2013-11-29 DIAGNOSIS — M109 Gout, unspecified: Secondary | ICD-10-CM | POA: Insufficient documentation

## 2013-11-29 DIAGNOSIS — Z79899 Other long term (current) drug therapy: Secondary | ICD-10-CM | POA: Insufficient documentation

## 2013-11-29 MED ORDER — OXYCODONE-ACETAMINOPHEN 5-325 MG PO TABS
2.0000 | ORAL_TABLET | Freq: Once | ORAL | Status: AC
  Administered 2013-11-29: 2 via ORAL
  Filled 2013-11-29: qty 2

## 2013-11-29 MED ORDER — OXYCODONE-ACETAMINOPHEN 5-325 MG PO TABS: 2.0000 | ORAL_TABLET | ORAL | Status: DC | PRN

## 2013-11-29 MED ORDER — PREDNISONE 10 MG PO TABS: ORAL_TABLET | ORAL | Status: DC

## 2013-11-29 NOTE — ED Provider Notes (Signed)
Medical screening examination/treatment/procedure(s) were performed by non-physician practitioner and as supervising physician I was immediately available for consultation/collaboration.   EKG Interpretation None        Avianah Pellman F Tysheem Accardo, MD 11/29/13 2354 

## 2013-11-29 NOTE — Discharge Instructions (Signed)
Gout °Gout is an inflammatory arthritis caused by a buildup of uric acid crystals in the joints. Uric acid is a chemical that is normally present in the blood. When the level of uric acid in the blood is too high it can form crystals that deposit in your joints and tissues. This causes joint redness, soreness, and swelling (inflammation). Repeat attacks are common. Over time, uric acid crystals can form into masses (tophi) near a joint, destroying bone and causing disfigurement. Gout is treatable and often preventable. °CAUSES  °The disease begins with elevated levels of uric acid in the blood. Uric acid is produced by your body when it breaks down a naturally found substance called purines. Certain foods you eat, such as meats and fish, contain high amounts of purines. Causes of an elevated uric acid level include: °· Being passed down from parent to child (heredity). °· Diseases that cause increased uric acid production (such as obesity, psoriasis, and certain cancers). °· Excessive alcohol use. °· Diet, especially diets rich in meat and seafood. °· Medicines, including certain cancer-fighting medicines (chemotherapy), water pills (diuretics), and aspirin. °· Chronic kidney disease. The kidneys are no longer able to remove uric acid well. °· Problems with metabolism. °Conditions strongly associated with gout include: °· Obesity. °· High blood pressure. °· High cholesterol. °· Diabetes. °Not everyone with elevated uric acid levels gets gout. It is not understood why some people get gout and others do not. Surgery, joint injury, and eating too much of certain foods are some of the factors that can lead to gout attacks. °SYMPTOMS  °· An attack of gout comes on quickly. It causes intense pain with redness, swelling, and warmth in a joint. °· Fever can occur. °· Often, only one joint is involved. Certain joints are more commonly involved: °¨ Base of the big toe. °¨ Knee. °¨ Ankle. °¨ Wrist. °¨ Finger. °Without  treatment, an attack usually goes away in a few days to weeks. Between attacks, you usually will not have symptoms, which is different from many other forms of arthritis. °DIAGNOSIS  °Your caregiver will suspect gout based on your symptoms and exam. In some cases, tests may be recommended. The tests may include: °· Blood tests. °· Urine tests. °· X-rays. °· Joint fluid exam. This exam requires a needle to remove fluid from the joint (arthrocentesis). Using a microscope, gout is confirmed when uric acid crystals are seen in the joint fluid. °TREATMENT  °There are two phases to gout treatment: treating the sudden onset (acute) attack and preventing attacks (prophylaxis). °· Treatment of an Acute Attack. °¨ Medicines are used. These include anti-inflammatory medicines or steroid medicines. °¨ An injection of steroid medicine into the affected joint is sometimes necessary. °¨ The painful joint is rested. Movement can worsen the arthritis. °¨ You may use warm or cold treatments on painful joints, depending which works best for you. °· Treatment to Prevent Attacks. °¨ If you suffer from frequent gout attacks, your caregiver may advise preventive medicine. These medicines are started after the acute attack subsides. These medicines either help your kidneys eliminate uric acid from your body or decrease your uric acid production. You may need to stay on these medicines for a very long time. °¨ The early phase of treatment with preventive medicine can be associated with an increase in acute gout attacks. For this reason, during the first few months of treatment, your caregiver may also advise you to take medicines usually used for acute gout treatment. Be sure you   understand your caregiver's directions. Your caregiver may make several adjustments to your medicine dose before these medicines are effective. °¨ Discuss dietary treatment with your caregiver or dietitian. Alcohol and drinks high in sugar and fructose and foods  such as meat, poultry, and seafood can increase uric acid levels. Your caregiver or dietician can advise you on drinks and foods that should be limited. °HOME CARE INSTRUCTIONS  °· Do not take aspirin to relieve pain. This raises uric acid levels. °· Only take over-the-counter or prescription medicines for pain, discomfort, or fever as directed by your caregiver. °· Rest the joint as much as possible. When in bed, keep sheets and blankets off painful areas. °· Keep the affected joint raised (elevated). °· Apply warm or cold treatments to painful joints. Use of warm or cold treatments depends on which works best for you. °· Use crutches if the painful joint is in your leg. °· Drink enough fluids to keep your urine clear or pale yellow. This helps your body get rid of uric acid. Limit alcohol, sugary drinks, and fructose drinks. °· Follow your dietary instructions. Pay careful attention to the amount of protein you eat. Your daily diet should emphasize fruits, vegetables, whole grains, and fat-free or low-fat milk products. Discuss the use of coffee, vitamin C, and cherries with your caregiver or dietician. These may be helpful in lowering uric acid levels. °· Maintain a healthy body weight. °SEEK MEDICAL CARE IF:  °· You develop diarrhea, vomiting, or any side effects from medicines. °· You do not feel better in 24 hours, or you are getting worse. °SEEK IMMEDIATE MEDICAL CARE IF:  °· Your joint becomes suddenly more tender, and you have chills or a fever. °MAKE SURE YOU:  °· Understand these instructions. °· Will watch your condition. °· Will get help right away if you are not doing well or get worse. °Document Released: 05/02/2000 Document Revised: 08/30/2012 Document Reviewed: 12/17/2011 °[employer]® Patient Information ©2015 [employer]. This information is not intended to replace advice given to you by your health care provider. Make sure you discuss any questions you have with your health care  provider. ° °Low-Purine Diet °Purines are compounds that affect the level of uric acid in your body. A low-purine diet is a diet that is low in purines. Eating a low-purine diet can prevent the level of uric acid in your body from getting too high and causing gout or kidney stones or both. °WHAT DO I NEED TO KNOW ABOUT THIS DIET? °· Choose low-purine foods. Examples of low-purine foods are listed in the next section. °· Drink plenty of fluids, especially water. Fluids can help remove uric acid from your body. Try to drink 8-16 cups (1.9-3.8 L) a day. °· Limit foods high in fat, especially saturated fat, as fat makes it harder for the body to get rid of uric acid. Foods high in saturated fat include pizza, cheese, ice cream, whole milk, fried foods, and gravies. Choose foods that are lower in fat and lean sources of protein. Use olive oil when cooking as it contains healthy fats that are not high in saturated fat. °· Limit alcohol. Alcohol interferes with the elimination of uric acid from your body. If you are having a gout attack, avoid all alcohol. °· Keep in mind that different people's bodies react differently to different foods. You will probably learn over time which foods do or do not affect you. If you discover that a food tends to cause your gout to flare   up, avoid eating that food. You can more freely enjoy foods that do not cause problems. If you have any questions about a food item, talk to your dietitian or health care provider. °WHICH FOODS ARE LOW, MODERATE, AND HIGH IN PURINES? °The following is a list of foods that are low, moderate, and high in purines. You can eat any amount of the foods that are low in purines. You may be able to have small amounts of foods that are moderate in purines. Ask your health care provider how much of a food moderate in purines you can have. Avoid foods high in purines. °Grains °· Foods low in purines: Enriched white bread, pasta, rice, cake, cornbread, popcorn. °· Foods  moderate in purines: Whole-grain breads and cereals, wheat germ, bran, oatmeal. Uncooked oatmeal. Dry wheat bran or wheat germ. °· Foods high in purines: Pancakes, French toast, biscuits, muffins. °Vegetables °· Foods low in purines: All vegetables, except those that are moderate in purines. °· Foods moderate in purines: Asparagus, cauliflower, spinach, mushrooms, green peas. °Fruits °· All fruits are low in purines. °Meats and other Protein Foods °· Foods low in purines: Eggs, nuts, peanut butter. °· Foods moderate in purines: 80-90% lean beef, lamb, veal, pork, poultry, fish, eggs, peanut butter, nuts. Crab, lobster, oysters, and shrimp. Cooked dried beans, peas, and lentils. °· Foods high in purines: Anchovies, sardines, herring, mussels, tuna, codfish, scallops, trout, and haddock. Bacon. Organ meats (such as liver or kidney). Tripe. Game meat. Goose. Sweetbreads. °Dairy °· All dairy foods are low in purines. Low-fat and fat-free dairy products are best because they are low in saturated fat. °Beverages °· Drinks low in purines: Water, carbonated beverages, tea, coffee, cocoa. °· Drinks moderate in purines: Soft drinks and other drinks sweetened with high-fructose corn syrup. Juices. To find whether a food or drink is sweetened with high-fructose corn syrup, look at the ingredients list. °· Drinks high in purines: Alcoholic beverages (such as beer). °Condiments °· Foods low in purines: Salt, herbs, olives, pickles, relishes, vinegar. °· Foods moderate in purines: Butter, margarine, oils, mayonnaise. °Fats and Oils °· Foods low in purines: All types, except gravies and sauces made with meat. °· Foods high in purines: Gravies and sauces made with meat. °Other Foods °· Foods low in purines: Sugars, sweets, gelatin. Cake. Soups made without meat. °· Foods moderate in purines: Meat-based or fish-based soups, broths, or bouillons. Foods and drinks sweetened with high-fructose corn syrup. °· Foods high in purines:  High-fat desserts (such as ice cream, cookies, cakes, pies, doughnuts, and chocolate). °Contact your dietitian for more information on foods that are not listed here. °Document Released: 08/30/2010 Document Revised: 05/10/2013 Document Reviewed: 04/11/2013 °[employer]® Patient Information ©2015 [employer]. This information is not intended to replace advice given to you by your health care provider. Make sure you discuss any questions you have with your health care provider. ° °

## 2013-11-29 NOTE — ED Notes (Signed)
C/o "gout flare up" x 3-4 days to left heel

## 2013-11-29 NOTE — ED Provider Notes (Signed)
CSN: [id number]     Arrival date & time 11/29/13  1919 History   First MD Initiated Contact with Patient 11/29/13 1928     Chief Complaint  Patient presents with  . Gout     (Consider location/radiation/quality/duration/timing/severity/associated sxs/prior Treatment) Patient is a 53 y.o. male presenting with lower extremity pain.  Foot Pain This is a new problem. The current episode started today. The problem occurs constantly. The problem has been gradually worsening. Associated symptoms include arthralgias and myalgias. Nothing aggravates the symptoms. He has tried nothing for the symptoms. The treatment provided no relief.   Pt complains of gout in his left heel.   Pt reports he has had before.  Pt can not walk due to pain Past Medical History  Diagnosis Date  . Hypertension   . Arthritis   . Gout    Past Surgical History  Procedure Laterality Date  . Hernia repair    . Wrist surgery    . Rotator cuff repair      right  . Toe surgery      R foot  . Testicle surgery    . Cervical spine surgery     No family history on file. History  Substance Use Topics  . Smoking status: Never Smoker   . Smokeless tobacco: Not on file  . Alcohol Use: No    Review of Systems  Musculoskeletal: Positive for arthralgias and myalgias.  All other systems reviewed and are negative.     Allergies  Review of patient's allergies indicates no known allergies.  Home Medications   Prior to Admission medications   Medication Sig Start Date End Date Taking? Authorizing Provider  diazepam (VALIUM) 5 MG tablet Take 5 mg by mouth every 6 (six) hours as needed for anxiety.   Yes Historical Provider, MD  Etodolac (LODINE PO) Take by mouth.   Yes Historical Provider, MD  ALLOPURINOL PO Take by mouth.    Historical Provider, MD  cyclobenzaprine (FLEXERIL) 5 MG tablet Take 10 mg by mouth 3 (three) times daily as needed.    Historical Provider, MD  hydrochlorothiazide (HYDRODIURIL) 25 MG tablet  Take 25 mg by mouth daily.    Historical Provider, MD  HYDROcodone-acetaminophen (NORCO) 5-325 MG per tablet Take 1 tablet by mouth every 6 (six) hours as needed.    Historical Provider, MD  INDOMETHACIN ER PO Take by mouth.    Historical Provider, MD  metoprolol succinate (TOPROL-XL) 100 MG 24 hr tablet Take 100 mg by mouth daily. Take with or immediately following a meal.    Historical Provider, MD  ondansetron (ZOFRAN) 4 MG tablet Take 1 tablet (4 mg total) by mouth every 6 (six) hours. 07/04/12   Ethelda Chick, MD  oxyCODONE-acetaminophen (PERCOCET/ROXICET) 5-325 MG per tablet Take 1-2 tablets by mouth every 4 (four) hours as needed for severe pain. 04/03/13   Shari A Upstill, PA-C  predniSONE (DELTASONE) 20 MG tablet Take 3 tablets day 2 (first day given in ED); Take 2 tablets days 3 and 4 Take 1 tablet days 5 and 6 04/03/13   Shari A Upstill, PA-C   BP 134/88  Pulse 58  Temp(Src) 98.2 F (36.8 C) (Oral)  Resp 16  Ht 5\' 9"  (1.753 m)  Wt 220 lb (99.791 kg)  BMI 32.47 kg/m2  SpO2 98% Physical Exam  Nursing note and vitals reviewed. Constitutional: He is oriented to person, place, and time. He appears well-developed and well-nourished.  HENT:  Head: Normocephalic and atraumatic.  Musculoskeletal: He exhibits tenderness.  Swollen left heel,   Neurological: He is alert and oriented to person, place, and time. He has normal reflexes.  Skin: Skin is warm.  Psychiatric: He has a normal mood and affect.    ED Course  Procedures (including critical care time) Labs Review Labs Reviewed - No data to display  Imaging Review No results found.   EKG Interpretation None      MDM   Final diagnoses:  Acute idiopathic gout of left foot    Percocet Prednisone taper Follow up with Primary Md for recheck   Elson AreasLeslie K Sofia, PA-C 11/29/13 2005

## 2014-05-10 ENCOUNTER — Other Ambulatory Visit: Payer: Self-pay | Admitting: Neurosurgery

## 2014-05-10 DIAGNOSIS — M7542 Impingement syndrome of left shoulder: Secondary | ICD-10-CM

## 2014-05-22 ENCOUNTER — Ambulatory Visit
Admission: RE | Admit: 2014-05-22 | Discharge: 2014-05-22 | Disposition: A | Discharge status: Home / Self Care | Payer: 59 | Source: Ambulatory Visit | Attending: Neurosurgery | Admitting: Neurosurgery

## 2014-05-22 DIAGNOSIS — M7542 Impingement syndrome of left shoulder: Secondary | ICD-10-CM

## 2015-08-04 ENCOUNTER — Emergency Department (HOSPITAL_BASED_OUTPATIENT_CLINIC_OR_DEPARTMENT_OTHER): Payer: 59

## 2015-08-04 ENCOUNTER — Encounter (HOSPITAL_BASED_OUTPATIENT_CLINIC_OR_DEPARTMENT_OTHER): Payer: Self-pay | Admitting: Emergency Medicine

## 2015-08-04 ENCOUNTER — Emergency Department (HOSPITAL_BASED_OUTPATIENT_CLINIC_OR_DEPARTMENT_OTHER)
Admission: EM | Admit: 2015-08-04 | Discharge: 2015-08-05 | Disposition: A | Discharge status: Home / Self Care | Payer: 59 | Attending: Emergency Medicine | Admitting: Emergency Medicine

## 2015-08-04 DIAGNOSIS — M1990 Unspecified osteoarthritis, unspecified site: Secondary | ICD-10-CM | POA: Insufficient documentation

## 2015-08-04 DIAGNOSIS — M109 Gout, unspecified: Secondary | ICD-10-CM | POA: Diagnosis not present

## 2015-08-04 DIAGNOSIS — R5383 Other fatigue: Secondary | ICD-10-CM | POA: Insufficient documentation

## 2015-08-04 DIAGNOSIS — M545 Low back pain: Secondary | ICD-10-CM | POA: Insufficient documentation

## 2015-08-04 DIAGNOSIS — R05 Cough: Secondary | ICD-10-CM | POA: Insufficient documentation

## 2015-08-04 DIAGNOSIS — R5381 Other malaise: Secondary | ICD-10-CM | POA: Insufficient documentation

## 2015-08-04 DIAGNOSIS — R0602 Shortness of breath: Secondary | ICD-10-CM | POA: Insufficient documentation

## 2015-08-04 DIAGNOSIS — R630 Anorexia: Secondary | ICD-10-CM | POA: Insufficient documentation

## 2015-08-04 DIAGNOSIS — R0600 Dyspnea, unspecified: Secondary | ICD-10-CM | POA: Insufficient documentation

## 2015-08-04 DIAGNOSIS — I10 Essential (primary) hypertension: Secondary | ICD-10-CM | POA: Insufficient documentation

## 2015-08-04 DIAGNOSIS — Z79899 Other long term (current) drug therapy: Secondary | ICD-10-CM | POA: Insufficient documentation

## 2015-08-04 DIAGNOSIS — R079 Chest pain, unspecified: Secondary | ICD-10-CM

## 2015-08-04 DIAGNOSIS — R61 Generalized hyperhidrosis: Secondary | ICD-10-CM | POA: Diagnosis not present

## 2015-08-04 LAB — CBC
HEMATOCRIT: 40.5 % (ref 39.0–52.0)
Hemoglobin: 14 g/dL (ref 13.0–17.0)
MCH: 27.8 pg (ref 26.0–34.0)
MCHC: 34.6 g/dL (ref 30.0–36.0)
MCV: 80.5 fL (ref 78.0–100.0)
PLATELETS: 279 10*3/uL (ref 150–400)
RBC: 5.03 MIL/uL (ref 4.22–5.81)
RDW: 13.1 % (ref 11.5–15.5)
WBC: 6.4 10*3/uL (ref 4.0–10.5)

## 2015-08-04 LAB — BASIC METABOLIC PANEL
Anion gap: 8 (ref 5–15)
BUN: 20 mg/dL (ref 6–20)
CHLORIDE: 102 mmol/L (ref 101–111)
CO2: 26 mmol/L (ref 22–32)
CREATININE: 1.3 mg/dL — AB (ref 0.61–1.24)
Calcium: 8.7 mg/dL — ABNORMAL LOW (ref 8.9–10.3)
GFR calc non Af Amer: 60 mL/min — ABNORMAL LOW (ref >60)
Glucose, Bld: 83 mg/dL (ref 65–99)
POTASSIUM: 3.8 mmol/L (ref 3.5–5.1)
Sodium: 136 mmol/L (ref 135–145)

## 2015-08-04 LAB — TROPONIN I
Troponin I: <0.03 ng/mL (ref <0.031)
Troponin I: <0.03 ng/mL (ref <0.031)

## 2015-08-04 MED ORDER — OXYCODONE-ACETAMINOPHEN 5-325 MG PO TABS: 1.0000 | ORAL_TABLET | ORAL | Status: DC | PRN

## 2015-08-04 MED ORDER — IOHEXOL 350 MG/ML SOLN
100.0000 mL | Freq: Once | INTRAVENOUS | Status: AC | PRN
  Administered 2015-08-04: 100 mL via INTRAVENOUS

## 2015-08-04 MED ORDER — KETOROLAC TROMETHAMINE 30 MG/ML IJ SOLN
30.0000 mg | Freq: Once | INTRAMUSCULAR | Status: AC
  Administered 2015-08-04: 30 mg via INTRAVENOUS
  Filled 2015-08-04: qty 1

## 2015-08-04 MED ORDER — HYDROMORPHONE HCL 1 MG/ML IJ SOLN
1.0000 mg | Freq: Once | INTRAMUSCULAR | Status: AC
  Administered 2015-08-04: 1 mg via INTRAVENOUS
  Filled 2015-08-04: qty 1

## 2015-08-04 NOTE — ED Notes (Signed)
Pt in c/o L lateral chest and side pain, radiating into back. Worse on movement or inspiration. Pt in NAD.

## 2015-08-04 NOTE — ED Provider Notes (Signed)
CSN: [id number]     Arrival date & time 08/04/15  1957 History  By signing my name below, I, [facility], attest that this documentation has been prepared under the direction and in the presence of Marily Memos, MD. Electronically Signed: Randell Patient, ED Scribe. 08/04/2015. 11:25 PM.   Chief Complaint  Patient presents with  . Chest Pain  . Back Pain    The history is provided by the patient. No language interpreter was used.   HPI Comments: Joshua Mercer is a 55 y.o. male who presents to the Emergency Department complaining of constant, mild,  lateral left chest pain and lower left back pain onset 3 days ago. Patient reports that he has been sick with recurrent cold-like symptoms for the past 4 months. He was seen by his PCP last week where he was prescribed a Z-pack which he finished medication 4 days ago, followed by onset of pain  He endorses dyspnea, productive cough, night diaphoresis (most recently one night ago), fatigue, decreased appetite, mild SOB earlier today, and general malaise. Pain is worse with cough and deep breath and unchanged by palpation. Denies EtOH abuse and current cigarette smoking (ceased smoking 34 years ago). Denies leg swelling, fever, nausea, vomiting, dysuria, and frequency.   Past Medical History  Diagnosis Date  . Hypertension   . Arthritis   . Gout    Past Surgical History  Procedure Laterality Date  . Hernia repair    . Wrist surgery    . Rotator cuff repair      right  . Toe surgery      R foot  . Testicle surgery    . Cervical spine surgery     History reviewed. No pertinent family history. Social History  Substance Use Topics  . Smoking status: Never Smoker   . Smokeless tobacco: None  . Alcohol Use: No    Review of Systems  Constitutional: Positive for diaphoresis, appetite change (decreased) and fatigue. Negative for fever.  Respiratory: Positive for cough.        Positive for dyspnea.  Cardiovascular: Positive  for chest pain. Negative for leg swelling.  Gastrointestinal: Negative for nausea and vomiting.  Genitourinary: Negative for dysuria and frequency.  Musculoskeletal: Positive for back pain (lower left).      Allergies  Review of patient's allergies indicates no known allergies.  Home Medications   Prior to Admission medications   Medication Sig Start Date End Date Taking? Authorizing Provider  ALLOPURINOL PO Take by mouth.    Historical Provider, MD  cyclobenzaprine (FLEXERIL) 5 MG tablet Take 10 mg by mouth 3 (three) times daily as needed.    Historical Provider, MD  diazepam (VALIUM) 5 MG tablet Take 5 mg by mouth every 6 (six) hours as needed for anxiety.    Historical Provider, MD  Etodolac (LODINE PO) Take by mouth.    Historical Provider, MD  hydrochlorothiazide (HYDRODIURIL) 25 MG tablet Take 25 mg by mouth daily.    Historical Provider, MD  INDOMETHACIN ER PO Take by mouth.    Historical Provider, MD  metoprolol succinate (TOPROL-XL) 100 MG 24 hr tablet Take 100 mg by mouth daily. Take with or immediately following a meal.    Historical Provider, MD  ondansetron (ZOFRAN) 4 MG tablet Take 1 tablet (4 mg total) by mouth every 6 (six) hours. 07/04/12   Jerelyn Scott, MD  oxyCODONE-acetaminophen (PERCOCET/ROXICET) 5-325 MG tablet Take 1-2 tablets by mouth every 4 (four) hours as needed for severe pain.  08/04/15   Marily Memos, MD  predniSONE (DELTASONE) 10 MG tablet 6,5,4,3,2,1 11/29/13   Elson Areas, PA-C  predniSONE (DELTASONE) 20 MG tablet Take 3 tablets day 2 (first day given in ED); Take 2 tablets days 3 and 4 Take 1 tablet days 5 and 6 04/03/13   Shari Upstill, PA-C   BP 139/96 mmHg  Pulse 66  Temp(Src) 97.8 F (36.6 C) (Oral)  Resp 16  Ht  (1.753 m)  Wt 215 lb (97.523 kg)  BMI 31.74 kg/m2  SpO2 98% Physical Exam  Constitutional: He is oriented to person, place, and time. He appears well-developed and well-nourished. No distress.  HENT:  Head: Normocephalic and  atraumatic.  Eyes: Conjunctivae and EOM are normal.  Neck: Neck supple. No tracheal deviation present.  Cardiovascular: Normal rate, regular rhythm, normal heart sounds and intact distal pulses.  Exam reveals no gallop and no friction rub.   No murmur heard. Normal heart.  Pulmonary/Chest: Effort normal. No respiratory distress.  Lungs CTA.  Musculoskeletal: Normal range of motion.  No BLE edema. No pain with dorsiflexion or plantar flexion. Good distal pulses. Pain is not reproducible.  Neurological: He is alert and oriented to person, place, and time.  Skin: Skin is warm and dry. No rash noted.  No rashes.   Psychiatric: He has a normal mood and affect. His behavior is normal.  Nursing note and vitals reviewed.   ED Course  Procedures   DIAGNOSTIC STUDIES: Oxygen Saturation is 98% on RA, normal by my interpretation.    COORDINATION OF CARE: 10:07 PM Will order Toradol, Diludid, Ominpaque, and angio chest CT. Discussed treatment plan with pt at bedside and pt agreed to plan.   Labs Review Labs Reviewed  BASIC METABOLIC PANEL - Abnormal; Notable for the following:    Creatinine, Ser 1.30 (*)    Calcium 8.7 (*)    GFR calc non Af Amer 60 (*)    All other components within normal limits  CBC  TROPONIN I  TROPONIN I    Imaging Review Dg Chest 2 View  08/04/2015  CLINICAL DATA:  Pt in c/o L lateral chest and side pain, radiating into back. Worse on movement or inspiration. HX HTN, gout, asthma EXAM: CHEST  2 VIEW COMPARISON:  None. FINDINGS: The heart size and mediastinal contours are within normal limits. Both lungs are clear. The visualized skeletal structures are unremarkable. Previous cervical fusion. IMPRESSION: No active cardiopulmonary disease. Electronically Signed   By: Norva Pavlov M.D.   On: 08/04/2015 21:33   Ct Angio Chest Pe W/cm &/or Wo Cm  08/04/2015  CLINICAL DATA:  Acute onset of left lateral chest pain and left lower back pain. Mild shortness of breath  and diaphoresis. Productive cough. Decreased appetite. Generalized malaise. Initial encounter. EXAM: CT ANGIOGRAPHY CHEST WITH CONTRAST TECHNIQUE: Multidetector CT imaging of the chest was performed using the standard protocol during bolus administration of intravenous contrast. Multiplanar CT image reconstructions and MIPs were obtained to evaluate the vascular anatomy. CONTRAST:  OMNIPAQUE IOHEXOL 350 MG/ML SOLN COMPARISON:  Chest radiograph performed earlier today at 9:13 p.m. FINDINGS: There is no evidence of pulmonary embolus. Minimal bibasilar atelectasis is noted. There is no evidence of significant focal consolidation, pleural effusion or pneumothorax. No masses are identified; no abnormal focal contrast enhancement is seen. The mediastinum is unremarkable in appearance. No mediastinal lymphadenopathy is seen. No pericardial effusion is identified. The great vessels are grossly unremarkable. No axillary lymphadenopathy is seen. The thyroid gland  is unremarkable in appearance. The visualized portions of the liver and spleen are unremarkable. No acute osseous abnormalities are seen. The patient is status post anterior cervical spinal fusion at the lower cervical spine. Review of the MIP images confirms the above findings. IMPRESSION: 1. No evidence of pulmonary embolus. 2. Minimal bibasilar atelectasis noted. Electronically Signed   By: Roanna RaiderJeffery  Chang M.D.   On: 08/04/2015 23:06   I have personally reviewed and evaluated these images and lab results as part of my medical decision-making.   EKG Interpretation   Date/Time:  Saturday August 04 2015 20:16:20 EDT Ventricular Rate:  69 PR Interval:  180 QRS Duration: 86 QT Interval:  404 QTC Calculation: 432 R Axis:   41 Text Interpretation:  Normal sinus rhythm Normal ECG No significant change  since last tracing Confirmed by Va Butler HealthcareMESNER MD, Barbara CowerJASON [phone]([id number]) on 08/04/2015  10:05:51 PM      MDM   Final diagnoses:  Chest pain, unspecified chest  pain type    CP and back pain possibly MSK. Negative for PE. Delta troponins negative. Delta ecg's without change. Doubt ACS.   New Prescriptions: Discharge Medication List as of 08/04/2015 11:49 PM       I have personally and contemperaneously reviewed labs and imaging and used in my decision making as above.   A medical screening exam was performed and I feel the patient has had an appropriate workup for their chief complaint at this time and likelihood of emergent condition existing is low. Their vital signs are stable. They have been counseled on decision, discharge, follow up and which symptoms necessitate immediate return to the emergency department.  They verbally stated understanding and agreement with plan and discharged in stable condition.    I personally performed the services described in this documentation, which was scribed in my presence. The recorded information has been reviewed and is accurate.    Marily MemosJason Shiquan Mathieu, MD 08/05/15 2325

## 2015-12-20 ENCOUNTER — Emergency Department (HOSPITAL_BASED_OUTPATIENT_CLINIC_OR_DEPARTMENT_OTHER)
Admission: EM | Admit: 2015-12-20 | Discharge: 2015-12-20 | Disposition: A | Discharge status: Home / Self Care | Payer: 59 | Attending: Emergency Medicine | Admitting: Emergency Medicine

## 2015-12-20 ENCOUNTER — Encounter (HOSPITAL_BASED_OUTPATIENT_CLINIC_OR_DEPARTMENT_OTHER): Payer: Self-pay | Admitting: *Deleted

## 2015-12-20 DIAGNOSIS — Z7689 Persons encountering health services in other specified circumstances: Secondary | ICD-10-CM

## 2015-12-20 DIAGNOSIS — L03312 Cellulitis of back [any part except buttock]: Secondary | ICD-10-CM | POA: Insufficient documentation

## 2015-12-20 DIAGNOSIS — Z0189 Encounter for other specified special examinations: Secondary | ICD-10-CM

## 2015-12-20 DIAGNOSIS — I10 Essential (primary) hypertension: Secondary | ICD-10-CM | POA: Insufficient documentation

## 2015-12-20 DIAGNOSIS — L02212 Cutaneous abscess of back [any part, except buttock]: Secondary | ICD-10-CM | POA: Diagnosis present

## 2015-12-20 MED ORDER — CEPHALEXIN 500 MG PO CAPS: 500.0000 mg | ORAL_CAPSULE | Freq: Four times a day (QID) | ORAL | 0 refills | Status: AC

## 2015-12-20 MED ORDER — LIDOCAINE-EPINEPHRINE (PF) 2 %-1:200000 IJ SOLN
20.0000 mL | Freq: Once | INTRAMUSCULAR | Status: AC
  Administered 2015-12-20: 20 mL
  Filled 2015-12-20: qty 20

## 2015-12-20 NOTE — ED Provider Notes (Signed)
[facility] Provider Note   CSN: [id number] Arrival date & time: 12/20/15  1845  First Provider Contact:   First MD Initiated Contact with Patient 12/20/15 1934   By signing my name below, I, Octavia Heir, attest that this documentation has been prepared under the direction and in the presence of [employer], PA-C.  Electronically Signed: Octavia Heir, ED Scribe. 12/20/15. 7:40 PM.    History   Chief Complaint Chief Complaint  Patient presents with  . Abscess    The history is provided by the patient. No language interpreter was used.   HPI Comments: Joshua Mercer is a 55 y.o. male who has a PMhx of gout and HTN presents to the Emergency Department complaining of a sudden onset, gradual worsening, moderate constant, right shoulder knot that he describes as sore onset one week ago. Pt reports the area on his back started as small as a dime and has gradually gotten worse since. He reports his area is worse with any touch. He denies fever, chills, nausea, or vomiting.  Past Medical History:  Diagnosis Date  . Arthritis   . Gout   . Hypertension     There are no active problems to display for this patient.   Past Surgical History:  Procedure Laterality Date  . CERVICAL SPINE SURGERY    . HERNIA REPAIR    . ROTATOR CUFF REPAIR     right  . TESTICLE SURGERY    . TOE SURGERY     R foot  . WRIST SURGERY         Home Medications    Prior to Admission medications   Medication Sig Start Date End Date Taking? Authorizing Provider  ALLOPURINOL PO Take by mouth.    Historical Provider, MD  cephALEXin (KEFLEX) 500 MG capsule Take 1 capsule (500 mg total) by mouth 4 (four) times daily. 12/20/15   Jerre Simon, PA  INDOMETHACIN ER PO Take by mouth.    Historical Provider, MD  metoprolol succinate (TOPROL-XL) 100 MG 24 hr tablet Take 100 mg by mouth daily. Take with or immediately following a meal.    Historical Provider, MD    Family History History  reviewed. No pertinent family history.  Social History Social History  Substance Use Topics  . Smoking status: Never Smoker  . Smokeless tobacco: Not on file  . Alcohol use No     Allergies   Review of patient's allergies indicates no known allergies.   Review of Systems Review of Systems  Constitutional: Negative for chills and fever.  Gastrointestinal: Negative for nausea and vomiting.  Skin: Positive for wound. Negative for rash.  Neurological: Negative for dizziness, syncope, weakness and headaches.     Physical Exam Updated Vital Signs BP 145/91   Pulse 63   Temp 98 F (36.7 C)   Resp 16   Ht 5\' 9"  (1.753 m)   Wt 215 lb (97.5 kg)   SpO2 99%   BMI 31.75 kg/m   Physical Exam  Constitutional: He appears well-developed and well-nourished. No distress.  HENT:  Head: Normocephalic and atraumatic.  Eyes: Conjunctivae are normal.  Pulmonary/Chest: Effort normal. No respiratory distress.  Musculoskeletal: Normal range of motion.  Neurological: He is alert. Coordination normal.  Skin: Skin is warm and dry. He is not diaphoretic.  Area to right upper back appox 4 cm of raised erythema and induration. No drainable abscess noted.  Psychiatric: He has a normal mood and affect. His behavior is normal.  Nursing note and vitals reviewed.    ED Treatments / Results  DIAGNOSTIC STUDIES: Oxygen Saturation is 99% on RA, normal by my interpretation.  COORDINATION OF CARE:  7:39 PM Discussed treatment plan with pt at bedside and pt agreed to plan.  Labs (all labs ordered are listed, but only abnormal results are displayed) Labs Reviewed - No data to display  EKG  EKG Interpretation None       Radiology No results found.  Procedures .Marland KitchenIncision and Drainage Date/Time: 12/20/2015 9:30 PM Performed by: Rhona Raider, Philip Kotlyar L Authorized by: Mattie Marlin L   Consent:    Consent obtained:  Verbal   Consent given by:  Patient   Risks discussed:  Bleeding, incomplete  drainage and infection Location:    Type:  Abscess   Size:  4cm   Location:  Trunk   Trunk location:  Back Pre-procedure details:    Skin preparation:  Betadine Anesthesia (see MAR for exact dosages):    Anesthesia method:  Local infiltration   Local anesthetic:  Lidocaine 2% WITH epi Procedure type:    Complexity:  Simple Procedure details:    Incision types:  Single straight   Incision depth:  Submucosal   Scalpel blade:  11   Wound management:  Irrigated with saline   Drainage:  Bloody and serosanguinous (scant purlent discharge)   Drainage amount:  Scant   Wound treatment:  Wound left open   Packing materials:  None Post-procedure details:    Patient tolerance of procedure:  Tolerated well, no immediate complications   (including critical care time)  Medications Ordered in ED Medications  lidocaine-EPINEPHrine (XYLOCAINE W/EPI) 2 %-1:200000 (PF) injection 20 mL (20 mLs Infiltration Given 12/20/15 2042)     Initial Impression / Assessment and Plan / ED Course  I have reviewed the triage vital signs and the nursing notes.  Pertinent labs & imaging results that were available during my care of the patient were reviewed by me and considered in my medical decision making (see chart for details).  Clinical Course    Patient with skin lesion amenable to incision and drainage. Very little purulent drainage was noted. Will treat as cellulitis and d/c on keflex. Was not packed. Instructed pt to have wound recheck in 2 days here at the ED. Discussed home wound care.  Will d/c to home.  Patient without systemic symptoms, afebrile, VSS. Discussed strict return precautions. Pt expressed understanding to the discharge instructions.   Case discussed and pt seen by Dr. Clydene Pugh who agrees with the above plan.  I personally performed the services described in this documentation, which was scribed in my presence. The recorded information has been reviewed and is accurate.    Final  Clinical Impressions(s) / ED Diagnoses   Final diagnoses:  Cellulitis of back except buttock  Encounter for incision and drainage procedure    New Prescriptions Discharge Medication List as of 12/20/2015  9:29 PM    START taking these medications   Details  cephALEXin (KEFLEX) 500 MG capsule Take 1 capsule (500 mg total) by mouth 4 (four) times daily., Starting Thu 12/20/2015, Print         Joyce Copa [place], [place] 12/21/15 0028    Lyndal Pulley, MD 12/21/15 [phone]

## 2015-12-20 NOTE — ED Triage Notes (Signed)
Pt c/o abscess to right  Shoulder x 1 week

## 2015-12-20 NOTE — ED Notes (Signed)
MD at bedside. 

## 2015-12-20 NOTE — Discharge Instructions (Signed)
Keep your wound clean and dry. Take the Keflex as prescribed and be sure to complete the entire course. Discontinue antibiotic if you experience a rash and return immediately to the emergency department. Follow-up here at the emergency department or in urgent care within 2 days to have your wound rechecked.  Return to emergency department sooner if you experience chills, fever,  increased pain, redness, warmth or foul discharge from the wound, or any other concerning symptoms

## 2016-09-20 ENCOUNTER — Emergency Department (HOSPITAL_BASED_OUTPATIENT_CLINIC_OR_DEPARTMENT_OTHER)
Admission: EM | Admit: 2016-09-20 | Discharge: 2016-09-20 | Disposition: A | Discharge status: Home / Self Care | Payer: 59 | Attending: Emergency Medicine | Admitting: Emergency Medicine

## 2016-09-20 ENCOUNTER — Encounter (HOSPITAL_BASED_OUTPATIENT_CLINIC_OR_DEPARTMENT_OTHER): Payer: Self-pay | Admitting: Emergency Medicine

## 2016-09-20 DIAGNOSIS — K5900 Constipation, unspecified: Secondary | ICD-10-CM | POA: Diagnosis not present

## 2016-09-20 DIAGNOSIS — K625 Hemorrhage of anus and rectum: Secondary | ICD-10-CM | POA: Diagnosis present

## 2016-09-20 DIAGNOSIS — Z79899 Other long term (current) drug therapy: Secondary | ICD-10-CM | POA: Insufficient documentation

## 2016-09-20 DIAGNOSIS — K602 Anal fissure, unspecified: Secondary | ICD-10-CM | POA: Insufficient documentation

## 2016-09-20 DIAGNOSIS — I10 Essential (primary) hypertension: Secondary | ICD-10-CM | POA: Diagnosis not present

## 2016-09-20 MED ORDER — PSYLLIUM 58.6 % PO POWD: 1.0000 | Freq: Three times a day (TID) | ORAL | 12 refills | Status: AC

## 2016-09-20 MED ORDER — TRAMADOL HCL 50 MG PO TABS: 50.0000 mg | ORAL_TABLET | Freq: Four times a day (QID) | ORAL | 0 refills | Status: AC | PRN

## 2016-09-20 MED ORDER — LIDOCAINE (ANORECTAL) 5 % EX GEL: 1.0000 | Freq: Four times a day (QID) | CUTANEOUS | 0 refills | Status: AC | PRN

## 2016-09-20 NOTE — Discharge Instructions (Signed)
Follow-up with your GI doctor or PCP as needed. Use tramadol very sparingly if you can. It can cause constipation which may make this worse.

## 2016-09-20 NOTE — ED Provider Notes (Signed)
[facility] Provider Note   CSN: [id number] Arrival date & time: 09/20/16  1557   By signing my name below, I, Clarisse GougeXavier Herndon, attest that this documentation has been prepared under the direction and in the presence of Raeford RazorKohut, Williette Loewe, MD. Electronically signed, Clarisse GougeXavier Herndon, ED Scribe. 09/20/16. 7:29 PM.   History   Chief Complaint Chief Complaint  Patient presents with  . Rectal Bleeding   The history is provided by the patient and medical records. No language interpreter was used.  Rectal Bleeding  Quality:  Unable to specify Amount:  Moderate Chronicity:  Recurrent Context: constipation, defecation, rectal pain and spontaneously   Pain details:    Quality:  Burning, tearing, pressure and heavy   Severity:  Severe   Timing:  Worse with defecation and constant   Progression:  Worsening Similar prior episodes: no   Worsened by:  Defecation and wiping Ineffective treatments:  Time Associated symptoms: no abdominal pain, no fever, no recent illness and no vomiting   Risk factors: no anticoagulant use     Joshua Mercer is a 56 y.o. male h/o hemorrhoids, HTN, hernia repair and testicle surgery, who presents to the Emergency Department with concern for constant, progressive rectal pain onset during a BM earlier today. Associated rectal bleeding, gradual onset constipation noted. Pt notes 2 BM's today. He states the 2nd was harder and he first noted rectal bleeding following this. Pt adds he had an urge to [facility]els [facility], but only blood came out. He notes no pain normally with h/o hemorrhoids. Colonoscopy with polyps found noted in 2013. 2 more colonoscopies noted in 2014 and 2015; he states he was told he would not have to return until 2020 during his last colonoscopy. No blood thinner use or any other complaints noted at this time.  Past Medical History:  Diagnosis Date  . Arthritis   . Gout   . Hypertension     There are no active problems to  display for this patient.   Past Surgical History:  Procedure Laterality Date  . CERVICAL SPINE SURGERY    . HERNIA REPAIR    . ROTATOR CUFF REPAIR     right  . TESTICLE SURGERY    . TOE SURGERY     R foot  . WRIST SURGERY         Home Medications    Prior to Admission medications   Medication Sig Start Date End Date Taking? Authorizing Provider  ALLOPURINOL PO Take by mouth.   Yes [provider]  metoprolol succinate (TOPROL-XL) 100 MG 24 hr tablet Take 100 mg by mouth daily. Take with or immediately following a meal.   Yes [provider]  cephALEXin (KEFLEX) 500 MG capsule Take 1 capsule (500 mg total) by mouth 4 (four) times daily. 12/20/15   Focht, Joyce CopaJessica L, PA  INDOMETHACIN ER PO Take by mouth.    [provider]    Family History No family history on file.  Social History Social History  Substance Use Topics  . Smoking status: Never Smoker  . Smokeless tobacco: Never Used  . Alcohol use No     Allergies   Patient has no known allergies.   Review of Systems Review of Systems  Constitutional: Negative for fever.  Gastrointestinal: Positive for anal bleeding, blood in stool, constipation, hematochezia and rectal pain. Negative for abdominal pain and vomiting.  Hematological: Does not bruise/bleed easily.  All other systems reviewed and are negative.  Physical Exam Updated Vital Signs BP (!) 156/104 (BP Location: Left Arm)   Pulse 67   Temp 98.2 F (36.8 C) (Oral)   Resp 20   Ht 5\' 9"  (1.753 m)   Wt 220 lb (99.8 kg)   SpO2 100%   BMI 32.49 kg/m   Physical Exam  Constitutional: He is oriented to person, place, and time. He appears well-developed and well-nourished.  HENT:  Head: Normocephalic and atraumatic.  Eyes: EOM are normal.  Neck: Normal range of motion.  Cardiovascular: Normal rate, regular rhythm, normal heart sounds and intact distal pulses.   Pulmonary/Chest: Effort normal and breath sounds normal. No  respiratory distress.  Abdominal: Soft. He exhibits no distension. There is no tenderness.  Genitourinary: Rectal exam shows fissure.  Genitourinary Comments: Small anal fissure in the midline posteriorly, no active bleeding  Musculoskeletal: Normal range of motion.  Neurological: He is alert and oriented to person, place, and time.  Skin: Skin is warm and dry.  Psychiatric: He has a normal mood and affect. Judgment normal.  Nursing note and vitals reviewed.    ED Treatments / Results  DIAGNOSTIC STUDIES: Oxygen Saturation is 100% on RA, NL by my interpretation.    COORDINATION OF CARE: 7:20 PM-Discussed next steps with pt. Pt verbalized understanding and is agreeable with the plan. Will Rx medication.   Labs (all labs ordered are listed, but only abnormal results are displayed) Labs Reviewed - No data to display  EKG  EKG Interpretation None       Radiology No results found.  Procedures Procedures (including critical care time)  Medications Ordered in ED Medications - No data to display   Initial Impression / Assessment and Plan / ED Course  I have reviewed the triage vital signs and the nursing notes.  Pertinent labs & imaging results that were available during my care of the patient were reviewed by me and considered in my medical decision making (see chart for details).     56yM with rectal fissure. increase bulk. Stay well hydrated. Topical meds given. Advised to try NSAIDs first. Tramadol sparingly if he feels he absolutely needs it because may cause contipation. Sitz baths. It has been determined that no acute conditions requiring further emergency intervention are present at this time. The patient has been advised of the diagnosis and plan. I reviewed any labs and imaging including any potential incidental findings. We have discussed signs and symptoms that warrant return to the ED and they are listed in the discharge instructions.    Final Clinical  Impressions(s) / ED Diagnoses   Final diagnoses:  Anal fissure    New Prescriptions New Prescriptions   No medications on file   I personally preformed the services scribed in my presence. The recorded information has been reviewed is accurate. Raeford Razor, MD.    Raeford Razor, MD 09/28/16 [phone]

## 2016-09-20 NOTE — ED Notes (Signed)
Pt given d/c instructions as per chart. Verbalizes understanding. No questions. Rx x 3 

## 2016-09-20 NOTE — ED Notes (Signed)
Pt amb to [facility] w/o asst 

## 2016-09-20 NOTE — ED Triage Notes (Addendum)
Had a "normal BM', soft , today and then had a 2nd stool, soft and had bright red blood on tissue and in commode. No prior hx of hemorrhoids . Rectal pain

## 2016-09-21 ENCOUNTER — Telehealth (HOSPITAL_BASED_OUTPATIENT_CLINIC_OR_DEPARTMENT_OTHER): Payer: Self-pay | Admitting: *Deleted

## 2017-09-07 DIAGNOSIS — S83422A Sprain of lateral collateral ligament of left knee, initial encounter: Secondary | ICD-10-CM | POA: Diagnosis not present

## 2017-09-07 DIAGNOSIS — Y939 Activity, unspecified: Secondary | ICD-10-CM | POA: Diagnosis not present

## 2017-09-07 DIAGNOSIS — Y999 Unspecified external cause status: Secondary | ICD-10-CM | POA: Insufficient documentation

## 2017-09-07 DIAGNOSIS — Y929 Unspecified place or not applicable: Secondary | ICD-10-CM | POA: Insufficient documentation

## 2017-09-07 DIAGNOSIS — S8992XA Unspecified injury of left lower leg, initial encounter: Secondary | ICD-10-CM | POA: Diagnosis present

## 2017-09-07 DIAGNOSIS — W172XXA Fall into hole, initial encounter: Secondary | ICD-10-CM | POA: Diagnosis not present

## 2017-09-07 DIAGNOSIS — Z79899 Other long term (current) drug therapy: Secondary | ICD-10-CM | POA: Insufficient documentation

## 2017-09-08 ENCOUNTER — Emergency Department (HOSPITAL_BASED_OUTPATIENT_CLINIC_OR_DEPARTMENT_OTHER): Payer: BLUE CROSS/BLUE SHIELD

## 2017-09-08 ENCOUNTER — Encounter (HOSPITAL_BASED_OUTPATIENT_CLINIC_OR_DEPARTMENT_OTHER): Payer: Self-pay | Admitting: Emergency Medicine

## 2017-09-08 ENCOUNTER — Emergency Department (HOSPITAL_BASED_OUTPATIENT_CLINIC_OR_DEPARTMENT_OTHER)
Admission: EM | Admit: 2017-09-08 | Discharge: 2017-09-08 | Disposition: A | Discharge status: Home / Self Care | Payer: BLUE CROSS/BLUE SHIELD | Attending: Emergency Medicine | Admitting: Emergency Medicine

## 2017-09-08 ENCOUNTER — Other Ambulatory Visit: Payer: Self-pay

## 2017-09-08 DIAGNOSIS — S83402A Sprain of unspecified collateral ligament of left knee, initial encounter: Secondary | ICD-10-CM

## 2017-09-08 MED ORDER — HYDROCODONE-ACETAMINOPHEN 5-325 MG PO TABS: 2.0000 | ORAL_TABLET | Freq: Four times a day (QID) | ORAL | 0 refills | Status: AC | PRN

## 2017-09-08 MED ORDER — IBUPROFEN 800 MG PO TABS: 800.0000 mg | ORAL_TABLET | Freq: Three times a day (TID) | ORAL | 0 refills | Status: AC | PRN

## 2017-09-08 MED ORDER — HYDROCODONE-ACETAMINOPHEN 5-325 MG PO TABS
2.0000 | ORAL_TABLET | Freq: Once | ORAL | Status: AC
  Administered 2017-09-08: 2 via ORAL
  Filled 2017-09-08: qty 2

## 2017-09-08 NOTE — ED Notes (Signed)
Pt. Has edema noted from the knee to the toes on the L leg.

## 2017-09-08 NOTE — ED Provider Notes (Signed)
TIME SEEN: 3:06 AM  CHIEF COMPLAINT: Left knee pain and swelling  HPI: Patient is a 57 year old male with history of hypertension, arthritis, gout who presents to the emergency department with left knee pain and swelling.  He states that 5 days ago he stepped into a deep hole and twisted his left knee.  He has noticed pain and swelling in this knee ever since and now the swelling is moved down his leg which made him more concerned.  Pain is worse with ambulation and movement of the knee.  No other injury.  States he did fall but did not strike his head.  Has chronic back pain but denies any new injury to the back.  No numbness or focal weakness.  No chest or abdominal pain.  ROS: See HPI Constitutional: no fever  Eyes: no drainage  ENT: no runny nose   Cardiovascular:  no chest pain  Resp: no SOB  GI: no vomiting GU: no dysuria Integumentary: no rash  Allergy: no hives  Musculoskeletal: no leg swelling  Neurological: no slurred speech ROS otherwise negative  PAST MEDICAL HISTORY/PAST SURGICAL HISTORY:  Past Medical History:  Diagnosis Date  . Arthritis   . Gout   . Hypertension     MEDICATIONS:  Prior to Admission medications   Medication Sig Start Date End Date Taking? Authorizing Provider  ALLOPURINOL PO Take 300 mg by mouth 1 day or 1 dose.    Yes [provider]  metoprolol succinate (TOPROL-XL) 100 MG 24 hr tablet Take 100 mg by mouth daily. Take with or immediately following a meal.   Yes [provider]  valACYclovir (VALTREX) 1000 MG tablet Take 500 mg by mouth daily.   Yes [provider]  cephALEXin (KEFLEX) 500 MG capsule Take 1 capsule (500 mg total) by mouth 4 (four) times daily. 12/20/15   Focht, Joyce CopaJessica L, PA  INDOMETHACIN ER PO Take by mouth.    [provider]  Lidocaine, Anorectal, 5 % GEL Apply 1 Squirt topically 4 (four) times daily as needed. 09/20/16   Raeford RazorKohut, Stephen, MD  psyllium (METAMUCIL SMOOTH TEXTURE) 58.6 % powder Take  1 packet by mouth 3 (three) times daily. 09/20/16   Raeford RazorKohut, Stephen, MD  traMADol (ULTRAM) 50 MG tablet Take 1 tablet (50 mg total) by mouth every 6 (six) hours as needed. 09/20/16   Raeford RazorKohut, Stephen, MD    ALLERGIES:  No Known Allergies  SOCIAL HISTORY:  Social History   Tobacco Use  . Smoking status: Never Smoker  . Smokeless tobacco: Never Used  Substance Use Topics  . Alcohol use: No    FAMILY HISTORY: No family history on file.  EXAM: BP (!) 167/93 (BP Location: Left Arm)   Pulse 72   Temp 98.3 F (36.8 C) (Oral)   Resp 20   Ht 5\' 9"  (1.753 m)   Wt 102.1 kg (225 lb)   SpO2 99%   BMI 33.23 kg/m  CONSTITUTIONAL: Alert and oriented and responds appropriately to questions. Well-appearing; well-nourished HEAD: Normocephalic EYES: Conjunctivae clear, pupils appear equal, EOMI ENT: normal nose; moist mucous membranes NECK: Supple, no meningismus, no nuchal rigidity, no LAD  CARD: RRR; S1 and S2 appreciated; no murmurs, no clicks, no rubs, no gallops RESP: Normal chest excursion without splinting or tachypnea; breath sounds clear and equal bilaterally; no wheezes, no rhonchi, no rales, no hypoxia or respiratory distress, speaking full sentences ABD/GI: Normal bowel sounds; non-distended; soft, non-tender, no rebound, no guarding, no peritoneal signs, no hepatosplenomegaly BACK:  The back appears normal and is non-tender to palpation, there is no CVA tenderness EXT: Tender diffusely over the left anterior knee especially over the joint line and the medial and lateral aspects without ligamentous laxity.  He has 2+ DP pulses bilaterally.  Minimal swelling noted around the left knee but no significant swelling in the calf, ankle or foot.  He has no bony tenderness over the tibia, fibula, ankle, foot.  No tenderness over the femur.  Normal ROM in all joints; otherwise extremities are non-tender to palpation; no edema; normal capillary refill; no cyanosis, no calf tenderness or swelling; ; no  redness or warmth, compartments are soft SKIN: Normal color for age and race; warm; no rash NEURO: Moves all extremities equally, normal sensation diffusely PSYCH: The patient's mood and manner are appropriate. Grooming and personal hygiene are appropriate.  MEDICAL DECISION MAKING: Patient here with left knee injury.  Suspect left knee sprain.  X-ray shows no acute abnormality.  No sign of compartment syndrome.  Doubt DVT.  Neurovascularly intact distally.  Given Vicodin for pain control.  Will discharge with knee sleeve and crutches for comfort.  Recommended rest, elevation and ice.  Given outpatient follow-up if symptoms not improving as he may need MRI for further evaluation.  No other sign of injury on examination today.  Discharge with prescription of Vicodin and ibuprofen.  Patient and wife comfortable with this plan.  At this time, I do not feel there is any life-threatening condition present. I have reviewed and discussed all results (EKG, imaging, lab, urine as appropriate) and exam findings with patient/family. I have reviewed nursing notes and appropriate previous records.  I feel the patient is safe to be discharged home without further emergent workup and can continue workup as an outpatient as needed. Discussed usual and customary return precautions. Patient/family verbalize understanding and are comfortable with this plan.  Outpatient follow-up has been provided if needed. All questions have been answered.     Alegandra Sommers, Layla Maw, DO 09/08/17 [phone]

## 2017-09-08 NOTE — ED Triage Notes (Addendum)
States last Thursday stepped in a hole that went down to left knee. Today with pain and swelling today, worse than usual . Declines W/C, gait steady

## 2019-03-07 IMAGING — DX DG KNEE COMPLETE 4+V*L*
4 series · 4 of 4 positions shown · non-contrast
Comparison: None.

CLINICAL DATA: Medial left knee pain after stepping into a hole.

EXAM:
LEFT KNEE - COMPLETE 4+ VIEW

[knee ap]
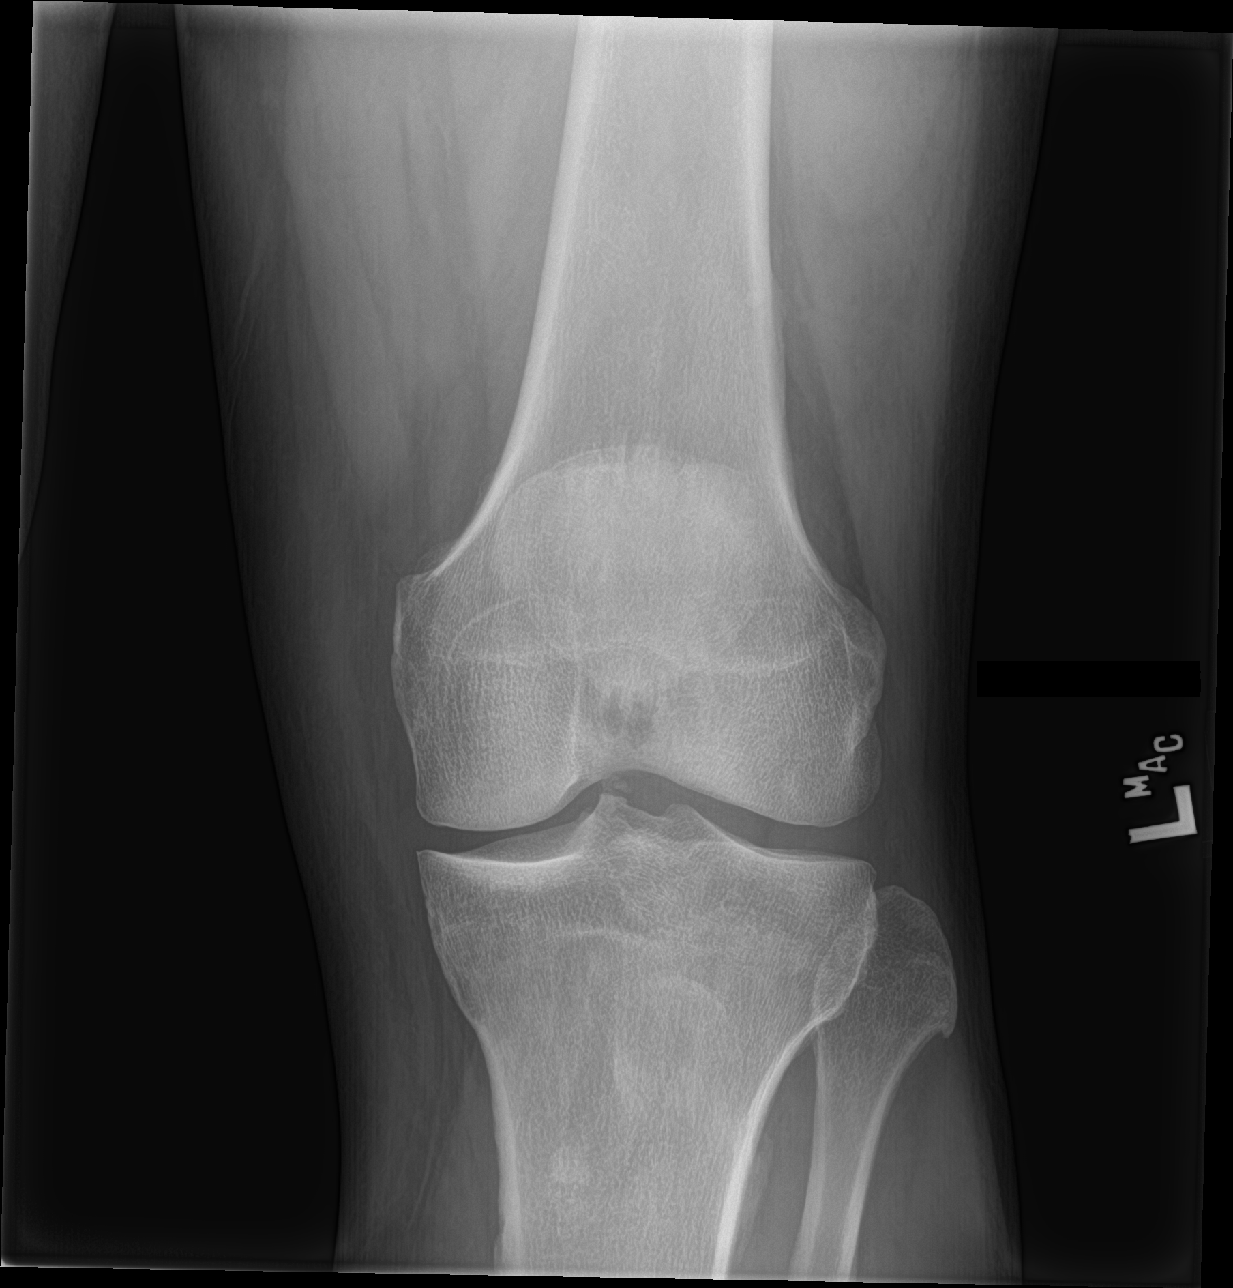

[tunnel]
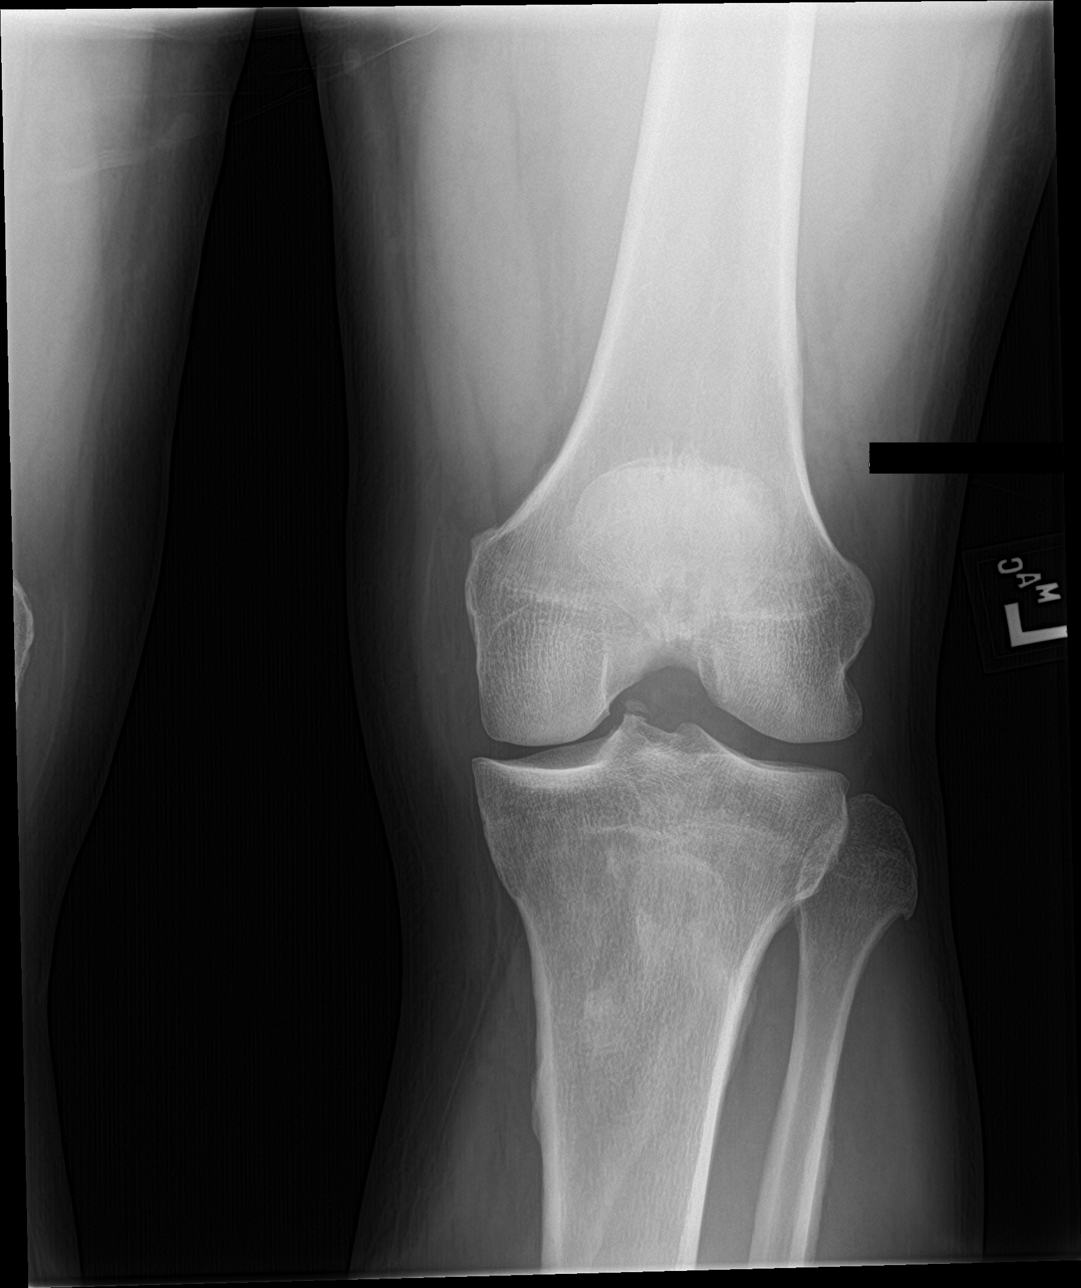

[knee lat]
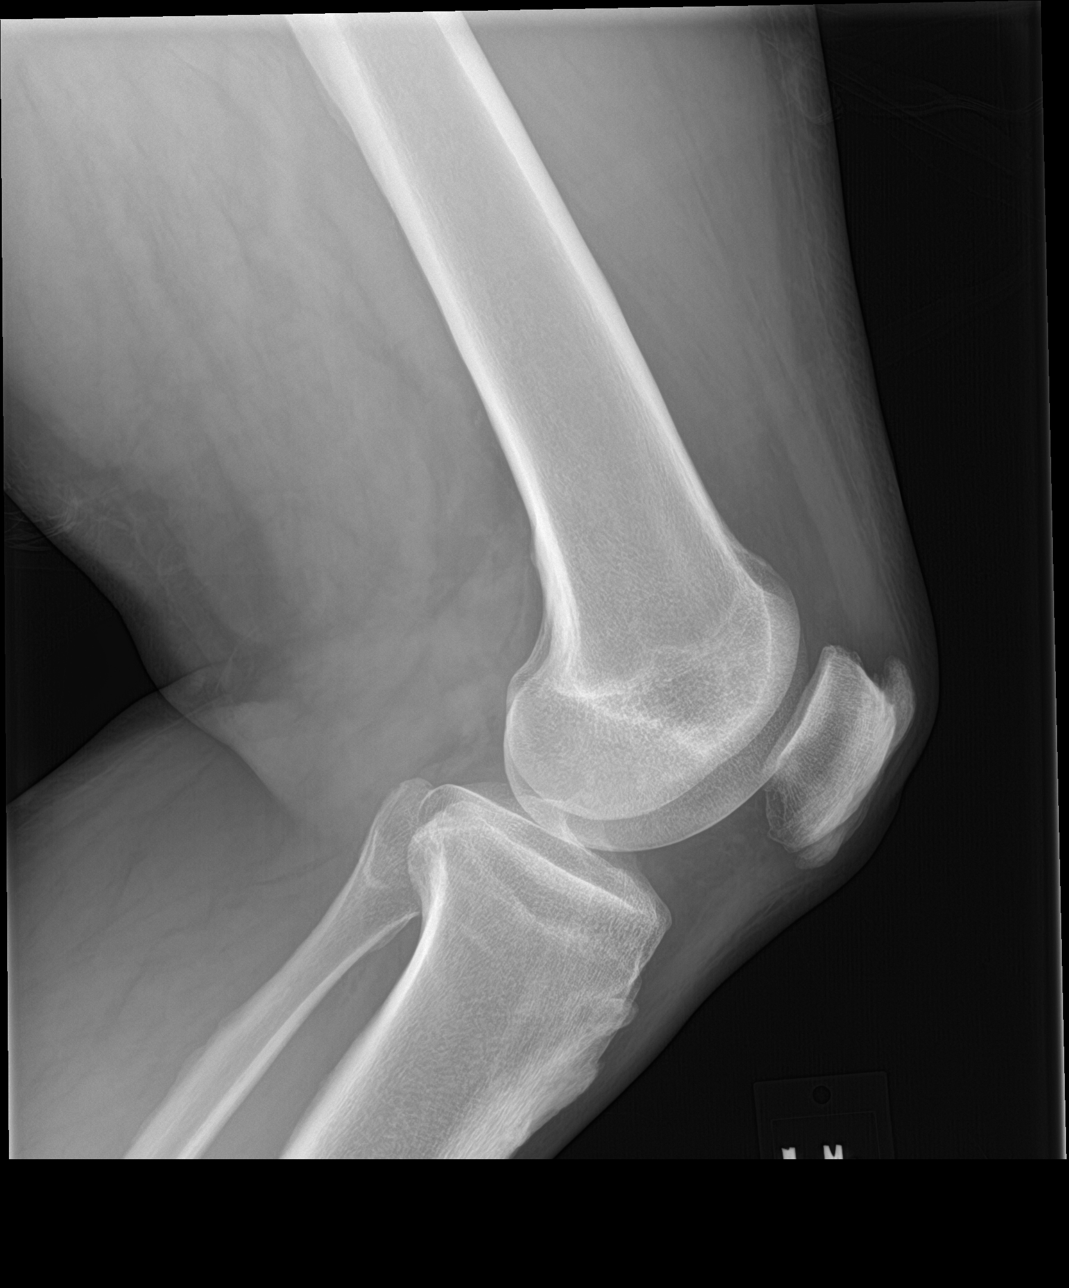

[knee sunrise]
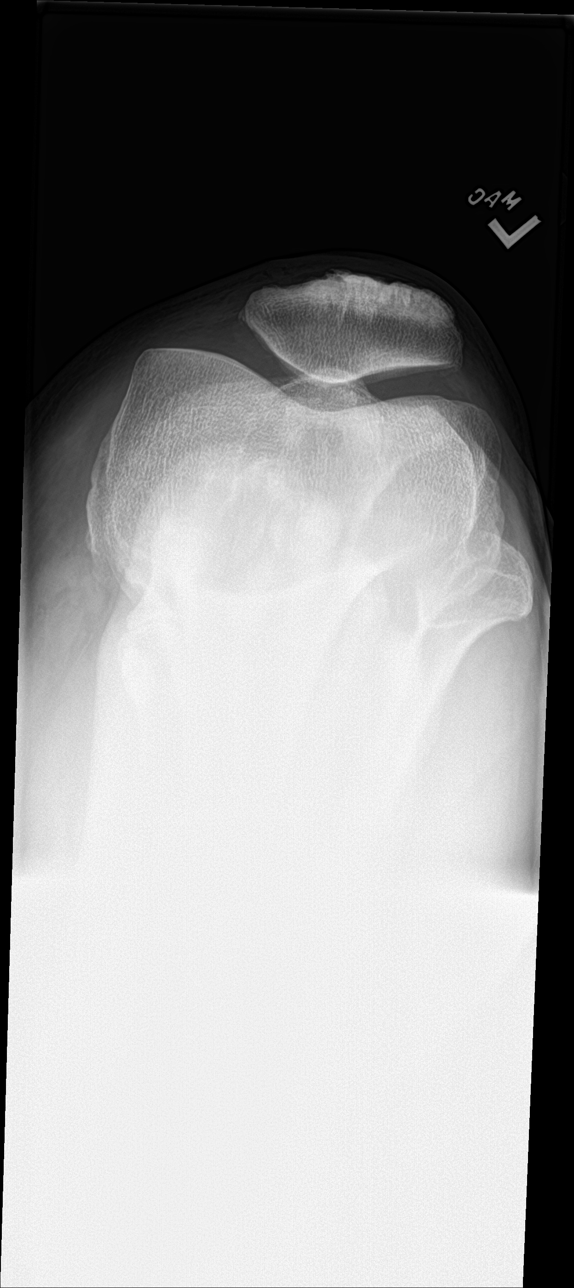

[4 of 4 positions shown; findings below may reference images not displayed]

FINDINGS: Slight femorotibial joint space narrowing. Patellar spurring of the
lower and upper pole. No joint effusion. Minimal degenerative change
off the tip of the medial tibial spine.
IMPRESSION: Mild degenerative joint space narrowing of the left knee. No acute
osseous appearing abnormality. No joint effusion.
